# Patient Record
Sex: Female | Born: 1960
Health system: Southern US, Community
[De-identification: ages and names within clinical notes are randomized; demographics above are authoritative.]

## PROBLEM LIST (undated history)

## (undated) DIAGNOSIS — I10 Essential (primary) hypertension: Secondary | ICD-10-CM

## (undated) HISTORY — DX: Essential (primary) hypertension: I10

## (undated) HISTORY — PX: LEEP: SHX91

## (undated) HISTORY — PX: BREAST SURGERY: SHX581

## (undated) HISTORY — PX: BREAST EXCISIONAL BIOPSY: SUR124

---

## 2014-05-08 ENCOUNTER — Telehealth: Payer: Self-pay | Admitting: Family Medicine

## 2014-05-08 NOTE — Telephone Encounter (Signed)
Pt requesting new pt apt with pap and with a woman provider. [place] but now has insurance and needs to establish care. Pt given appt with Jannifer RodneyChristy Hawks 06/07/14 @ 9:10. Pt aware to arrive 15 minutes early and to bring insurance card and any current meds, pt states she takes amitryptaline and a BP med.

## 2014-06-07 ENCOUNTER — Ambulatory Visit (INDEPENDENT_AMBULATORY_CARE_PROVIDER_SITE_OTHER): Payer: PRIVATE HEALTH INSURANCE | Admitting: Family

## 2014-06-07 ENCOUNTER — Encounter (INDEPENDENT_AMBULATORY_CARE_PROVIDER_SITE_OTHER): Payer: Self-pay

## 2014-06-07 ENCOUNTER — Encounter: Payer: Self-pay | Admitting: Family

## 2014-06-07 VITALS — BP 140/91 | HR 81 | Temp 97.3°F | Ht 68.0 in | Wt 160.6 lb

## 2014-06-07 DIAGNOSIS — I10 Essential (primary) hypertension: Secondary | ICD-10-CM

## 2014-06-07 DIAGNOSIS — Z01419 Encounter for gynecological examination (general) (routine) without abnormal findings: Secondary | ICD-10-CM

## 2014-06-07 DIAGNOSIS — Z1321 Encounter for screening for nutritional disorder: Secondary | ICD-10-CM

## 2014-06-07 DIAGNOSIS — Z23 Encounter for immunization: Secondary | ICD-10-CM

## 2014-06-07 DIAGNOSIS — Z0000 Encounter for general adult medical examination without abnormal findings: Secondary | ICD-10-CM

## 2014-06-07 LAB — POCT URINALYSIS DIPSTICK
BILIRUBIN UA: NEGATIVE
Blood, UA: NEGATIVE
GLUCOSE UA: NEGATIVE
Ketones, UA: NEGATIVE
LEUKOCYTES UA: NEGATIVE
Nitrite, UA: NEGATIVE
PROTEIN UA: NEGATIVE
Spec Grav, UA: <=1.005
UROBILINOGEN UA: NEGATIVE
pH, UA: 6

## 2014-06-07 LAB — POCT UA - MICROSCOPIC ONLY
Bacteria, U Microscopic: NEGATIVE
Casts, Ur, LPF, POC: NEGATIVE
Crystals, Ur, HPF, POC: NEGATIVE
Mucus, UA: NEGATIVE
RBC, URINE, MICROSCOPIC: NEGATIVE
WBC, Ur, HPF, POC: NEGATIVE
Yeast, UA: NEGATIVE

## 2014-06-07 MED ORDER — ATENOLOL 50 MG PO TABS: 50.0000 mg | ORAL_TABLET | Freq: Every day | ORAL | Status: DC

## 2014-06-07 MED ORDER — HYDROCHLOROTHIAZIDE 25 MG PO TABS: 25.0000 mg | ORAL_TABLET | Freq: Every day | ORAL | Status: DC

## 2014-06-07 NOTE — Patient Instructions (Signed)

## 2014-06-07 NOTE — Addendum Note (Signed)
Addended by: Prescott GumLAND, Ramandeep Arington M on: 06/07/2014 10:36 AM   Modules accepted: Kipp BroodSmartSet

## 2014-06-07 NOTE — Addendum Note (Signed)
Addended by: Almeta MonasSTONE, JANIE M on: 06/07/2014 11:11 AM   Modules accepted: Orders

## 2014-06-07 NOTE — Progress Notes (Signed)
Subjective:    Patient ID: April Kane, female    DOB: [birth year], 54 y.o.   MRN: [id number]  Pt presents to the office for CPE with pap.  Gynecologic Exam Pertinent negatives include no headaches.  Hypertension This is a chronic problem. The current episode started more than 1 year ago. The problem has been waxing and waning since onset. The problem is uncontrolled. Pertinent negatives include no anxiety, headaches, palpitations, peripheral edema, shortness of breath or sweats. Risk factors for coronary artery disease include post-menopausal state, sedentary lifestyle and dyslipidemia. Past treatments include beta blockers (Pt had been on HCTZ but has not had it filled in last few months). The current treatment provides mild improvement. There is no history of kidney disease, CAD/MI, CVA, heart failure or a thyroid problem. There is no history of sleep apnea.      Review of Systems  Constitutional: Negative.   HENT: Negative.   Eyes: Negative.   Respiratory: Negative.  Negative for shortness of breath.   Cardiovascular: Negative.  Negative for palpitations.  Gastrointestinal: Negative.   Endocrine: Negative.   Genitourinary: Negative.   Musculoskeletal: Negative.   Neurological: Negative.  Negative for headaches.  Hematological: Negative.   Psychiatric/Behavioral: Negative.   All other systems reviewed and are negative.      Objective:   Physical Exam  Constitutional: She is oriented to person, place, and time. She appears well-developed and well-nourished. No distress.  HENT:  Head: Normocephalic and atraumatic.  Right Ear: External ear normal.  Mouth/Throat: Oropharynx is clear and moist.  Eyes: Pupils are equal, round, and reactive to light.  Neck: Normal range of motion. Neck supple. No thyromegaly present.  Cardiovascular: Normal rate, regular rhythm, normal heart sounds and intact distal pulses.   No murmur heard. Pulmonary/Chest: Effort normal and breath sounds normal.  No respiratory distress. She has no wheezes. Right breast exhibits no inverted nipple, no mass, no nipple discharge, no skin change and no tenderness. Left breast exhibits no inverted nipple, no mass, no nipple discharge, no skin change and no tenderness. Breasts are symmetrical.  Abdominal: Soft. Bowel sounds are normal. She exhibits no distension. There is no tenderness.  Genitourinary:  Bimanual exam- no adnexal masses or tenderness, ovaries nonpalpable   Cervix parous and pink- No discharge   Musculoskeletal: Normal range of motion. She exhibits no edema or tenderness.  Neurological: She is alert and oriented to person, place, and time. She has normal reflexes. No cranial nerve deficit.  Skin: Skin is warm and dry.  Psychiatric: She has a normal mood and affect. Her behavior is normal. Judgment and thought content normal.  Vitals reviewed.     BP 140/91 mmHg  Pulse 81  Temp(Src) 97.3 F (36.3 C) (Oral)  Ht '5\' 8"'  (1.727 m)  Wt 160 lb 9.6 oz (72.848 kg)  BMI 24.42 kg/m2  LMP 04/20/2013     Assessment & Plan:  1. Encounter for routine gynecological examination - POCT urinalysis dipstick - POCT UA - Microscopic Only - Pap IG w/ reflex to HPV when ASC-U  2. Essential hypertension - CMP14+EGFR - hydrochlorothiazide (HYDRODIURIL) 25 MG tablet; Take 1 tablet (25 mg total) by mouth daily.  Dispense: 90 tablet; Refill: 3 - atenolol (TENORMIN) 50 MG tablet; Take 1 tablet (50 mg total) by mouth daily.  Dispense: 90 tablet; Refill: 3  3. Annual physical exam - CMP14+EGFR - Lipid panel - Thyroid Panel With TSH - Vit D  25 hydroxy (rtn osteoporosis monitoring) - Pap IG w/  reflex to HPV when ASC-U  4. Encounter for vitamin deficiency screening - Vit D  25 hydroxy (rtn osteoporosis monitoring)   Continue all meds Labs pending Health Maintenance reviewed-hemoccult cards given to patient with directions, Pt to schedule mammogram appt Diet and exercise encouraged RTO 1  year  Evelina Dun, FNP

## 2014-06-08 LAB — THYROID PANEL WITH TSH
Free Thyroxine Index: 1.9 (ref 1.2–4.9)
T3 UPTAKE RATIO: 28 % (ref 24–39)
T4 TOTAL: 6.8 ug/dL (ref 4.5–12.0)
TSH: 1.09 u[IU]/mL (ref 0.450–4.500)

## 2014-06-08 LAB — CMP14+EGFR
A/G RATIO: 2.1 (ref 1.1–2.5)
ALT: 9 IU/L (ref 0–32)
AST: 16 IU/L (ref 0–40)
Albumin: 4.1 g/dL (ref 3.5–5.5)
Alkaline Phosphatase: 86 IU/L (ref 39–117)
BUN/Creatinine Ratio: 15 (ref 9–23)
BUN: 12 mg/dL (ref 6–24)
Bilirubin Total: 0.3 mg/dL (ref 0.0–1.2)
CALCIUM: 9.2 mg/dL (ref 8.7–10.2)
CHLORIDE: 103 mmol/L (ref 97–108)
CO2: 24 mmol/L (ref 18–29)
Creatinine, Ser: 0.81 mg/dL (ref 0.57–1.00)
GFR, EST AFRICAN AMERICAN: 97 mL/min/{1.73_m2} (ref >59)
GFR, EST NON AFRICAN AMERICAN: 84 mL/min/{1.73_m2} (ref >59)
Globulin, Total: 2 g/dL (ref 1.5–4.5)
Glucose: 112 mg/dL — ABNORMAL HIGH (ref 65–99)
POTASSIUM: 4 mmol/L (ref 3.5–5.2)
SODIUM: 142 mmol/L (ref 134–144)
TOTAL PROTEIN: 6.1 g/dL (ref 6.0–8.5)

## 2014-06-08 LAB — LIPID PANEL
Chol/HDL Ratio: 4.6 ratio units — ABNORMAL HIGH (ref 0.0–4.4)
Cholesterol, Total: 176 mg/dL (ref 100–199)
HDL: 38 mg/dL — AB (ref >39)
LDL Calculated: 115 mg/dL — ABNORMAL HIGH (ref 0–99)
Triglycerides: 115 mg/dL (ref 0–149)
VLDL CHOLESTEROL CAL: 23 mg/dL (ref 5–40)

## 2014-06-08 LAB — VITAMIN D 25 HYDROXY (VIT D DEFICIENCY, FRACTURES): VIT D 25 HYDROXY: 12.6 ng/mL — AB (ref 30.0–100.0)

## 2014-06-12 LAB — PAP IG W/ RFLX HPV ASCU: PAP Smear Comment: 0

## 2014-11-23 ENCOUNTER — Telehealth: Payer: Self-pay | Admitting: Family

## 2014-11-23 NOTE — Telephone Encounter (Signed)
Christys pt 

## 2014-11-24 MED ORDER — AMITRIPTYLINE HCL 100 MG PO TABS: 100.0000 mg | ORAL_TABLET | Freq: Every day | ORAL | Status: DC

## 2014-11-24 NOTE — Telephone Encounter (Signed)
Patient informed that prescription was sent in.

## 2014-11-24 NOTE — Telephone Encounter (Signed)
elavil filled

## 2015-03-27 ENCOUNTER — Other Ambulatory Visit: Payer: Self-pay | Admitting: Nurse Practitioner

## 2015-03-27 NOTE — Telephone Encounter (Signed)
[facility] 06/07/14  [facility]

## 2015-03-28 ENCOUNTER — Other Ambulatory Visit: Payer: Self-pay | Admitting: Family

## 2015-03-28 NOTE — Telephone Encounter (Signed)
Pt advised rx was sent to pharmacy yesterday.

## 2015-06-05 ENCOUNTER — Other Ambulatory Visit: Payer: Self-pay | Admitting: Family

## 2015-06-06 ENCOUNTER — Other Ambulatory Visit: Payer: PRIVATE HEALTH INSURANCE | Admitting: Family

## 2015-06-13 ENCOUNTER — Ambulatory Visit (INDEPENDENT_AMBULATORY_CARE_PROVIDER_SITE_OTHER): Payer: Managed Care, Other (non HMO) | Admitting: Family

## 2015-06-13 ENCOUNTER — Encounter: Payer: Self-pay | Admitting: Family

## 2015-06-13 VITALS — BP 123/80 | HR 84 | Temp 98.2°F | Ht 68.0 in | Wt 158.0 lb

## 2015-06-13 DIAGNOSIS — Z0000 Encounter for general adult medical examination without abnormal findings: Secondary | ICD-10-CM

## 2015-06-13 DIAGNOSIS — M797 Fibromyalgia: Secondary | ICD-10-CM

## 2015-06-13 DIAGNOSIS — I10 Essential (primary) hypertension: Secondary | ICD-10-CM

## 2015-06-13 DIAGNOSIS — Z1159 Encounter for screening for other viral diseases: Secondary | ICD-10-CM

## 2015-06-13 DIAGNOSIS — Z1211 Encounter for screening for malignant neoplasm of colon: Secondary | ICD-10-CM

## 2015-06-13 NOTE — Patient Instructions (Signed)
Health Maintenance, Female Adopting a healthy lifestyle and getting preventive care can go a long way to promote health and wellness. Talk with your health care provider about what schedule of regular examinations is right for you. This is a good chance for you to check in with your provider about disease prevention and staying healthy. In between checkups, there are plenty of things you can do on your own. Experts have done a lot of research about which lifestyle changes and preventive measures are most likely to keep you healthy. Ask your health care provider for more information. WEIGHT AND DIET  Eat a healthy diet  Be sure to include plenty of vegetables, fruits, low-fat dairy products, and lean protein.  Do not eat a lot of foods high in solid fats, added sugars, or salt.  Get regular exercise. This is one of the most important things you can do for your health.  Most adults should exercise for at least 150 minutes each week. The exercise should increase your heart rate and make you sweat (moderate-intensity exercise).  Most adults should also do strengthening exercises at least twice a week. This is in addition to the moderate-intensity exercise.  Maintain a healthy weight  Body mass index (BMI) is a measurement that can be used to identify possible weight problems. It estimates body fat based on height and weight. Your health care provider can help determine your BMI and help you achieve or maintain a healthy weight.  For females 20 years of age and older:   A BMI below 18.5 is considered underweight.  A BMI of 18.5 to 24.9 is normal.  A BMI of 25 to 29.9 is considered overweight.  A BMI of 30 and above is considered obese.  Watch levels of cholesterol and blood lipids  You should start having your blood tested for lipids and cholesterol at 53 years of age, then have this test every 5 years.  You may need to have your cholesterol levels checked more often if:  Your lipid  or cholesterol levels are high.  You are older than 53 years of age.  You are at high risk for heart disease.  CANCER SCREENING   Lung Cancer  Lung cancer screening is recommended for adults 55-80 years old who are at high risk for lung cancer because of a history of smoking.  A yearly low-dose CT scan of the lungs is recommended for people who:  Currently smoke.  Have quit within the past 15 years.  Have at least a 30-pack-year history of smoking. A pack year is smoking an average of one pack of cigarettes a day for 1 year.  Yearly screening should continue until it has been 15 years since you quit.  Yearly screening should stop if you develop a health problem that would prevent you from having lung cancer treatment.  Breast Cancer  Practice breast self-awareness. This means understanding how your breasts normally appear and feel.  It also means doing regular breast self-exams. Let your health care provider know about any changes, no matter how small.  If you are in your 20s or 30s, you should have a clinical breast exam (CBE) by a health care provider every 1-3 years as part of a regular health exam.  If you are 40 or older, have a CBE every year. Also consider having a breast X-ray (mammogram) every year.  If you have a family history of breast cancer, talk to your health care provider about genetic screening.  If you   are at high risk for breast cancer, talk to your health care provider about having an MRI and a mammogram every year.  Breast cancer gene (BRCA) assessment is recommended for women who have family members with BRCA-related cancers. BRCA-related cancers include:  Breast.  Ovarian.  Tubal.  Peritoneal cancers.  Results of the assessment will determine the need for genetic counseling and BRCA1 and BRCA2 testing. Cervical Cancer Your health care provider may recommend that you be screened regularly for cancer of the pelvic organs (ovaries, uterus, and  vagina). This screening involves a pelvic examination, including checking for microscopic changes to the surface of your cervix (Pap test). You may be encouraged to have this screening done every 3 years, beginning at age 21.  For women ages 30-65, health care providers may recommend pelvic exams and Pap testing every 3 years, or they may recommend the Pap and pelvic exam, combined with testing for human papilloma virus (HPV), every 5 years. Some types of HPV increase your risk of cervical cancer. Testing for HPV may also be done on women of any age with unclear Pap test results.  Other health care providers may not recommend any screening for nonpregnant women who are considered low risk for pelvic cancer and who do not have symptoms. Ask your health care provider if a screening pelvic exam is right for you.  If you have had past treatment for cervical cancer or a condition that could lead to cancer, you need Pap tests and screening for cancer for at least 20 years after your treatment. If Pap tests have been discontinued, your risk factors (such as having a new sexual partner) need to be reassessed to determine if screening should resume. Some women have medical problems that increase the chance of getting cervical cancer. In these cases, your health care provider may recommend more frequent screening and Pap tests. Colorectal Cancer  This type of cancer can be detected and often prevented.  Routine colorectal cancer screening usually begins at 53 years of age and continues through 53 years of age.  Your health care provider may recommend screening at an earlier age if you have risk factors for colon cancer.  Your health care provider may also recommend using home test kits to check for hidden blood in the stool.  A small camera at the end of a tube can be used to examine your colon directly (sigmoidoscopy or colonoscopy). This is done to check for the earliest forms of colorectal  cancer.  Routine screening usually begins at age 50.  Direct examination of the colon should be repeated every 5-10 years through 53 years of age. However, you may need to be screened more often if early forms of precancerous polyps or small growths are found. Skin Cancer  Check your skin from head to toe regularly.  Tell your health care provider about any new moles or changes in moles, especially if there is a change in a mole's shape or color.  Also tell your health care provider if you have a mole that is larger than the size of a pencil eraser.  Always use sunscreen. Apply sunscreen liberally and repeatedly throughout the day.  Protect yourself by wearing long sleeves, pants, a wide-brimmed hat, and sunglasses whenever you are outside. HEART DISEASE, DIABETES, AND HIGH BLOOD PRESSURE   High blood pressure causes heart disease and increases the risk of stroke. High blood pressure is more likely to develop in:  People who have blood pressure in the high end   of the normal range (130-139/85-89 mm Hg).  People who are overweight or obese.  People who are African American.  If you are 38-23 years of age, have your blood pressure checked every 3-5 years. If you are 61 years of age or older, have your blood pressure checked every year. You should have your blood pressure measured twice--once when you are at a hospital or clinic, and once when you are not at a hospital or clinic. Record the average of the two measurements. To check your blood pressure when you are not at a hospital or clinic, you can use:  An automated blood pressure machine at a pharmacy.  A home blood pressure monitor.  If you are between 45 years and 39 years old, ask your health care provider if you should take aspirin to prevent strokes.  Have regular diabetes screenings. This involves taking a blood sample to check your fasting blood sugar level.  If you are at a normal weight and have a low risk for diabetes,  have this test once every three years after 53 years of age.  If you are overweight and have a high risk for diabetes, consider being tested at a younger age or more often. PREVENTING INFECTION  Hepatitis B  If you have a higher risk for hepatitis B, you should be screened for this virus. You are considered at high risk for hepatitis B if:  You were born in a country where hepatitis B is common. Ask your health care provider which countries are considered high risk.  Your parents were born in a high-risk country, and you have not been immunized against hepatitis B (hepatitis B vaccine).  You have HIV or AIDS.  You use needles to inject street drugs.  You live with someone who has hepatitis B.  You have had sex with someone who has hepatitis B.  You get hemodialysis treatment.  You take certain medicines for conditions, including cancer, organ transplantation, and autoimmune conditions. Hepatitis C  Blood testing is recommended for:  Everyone born from [birth year] through 1963.  Anyone with known risk factors for hepatitis C. Sexually transmitted infections (STIs)  You should be screened for sexually transmitted infections (STIs) including gonorrhea and chlamydia if:  You are sexually active and are younger than 53 years of age.  You are older than 53 years of age and your health care provider tells you that you are at risk for this type of infection.  Your sexual activity has changed since you were last screened and you are at an increased risk for chlamydia or gonorrhea. Ask your health care provider if you are at risk.  If you do not have HIV, but are at risk, it may be recommended that you take a prescription medicine daily to prevent HIV infection. This is called pre-exposure prophylaxis (PrEP). You are considered at risk if:  You are sexually active and do not regularly use condoms or know the HIV status of your partner(s).  You take drugs by injection.  You are sexually  active with a partner who has HIV. Talk with your health care provider about whether you are at high risk of being infected with HIV. If you choose to begin PrEP, you should first be tested for HIV. You should then be tested every 3 months for as long as you are taking PrEP.  PREGNANCY   If you are premenopausal and you may become pregnant, ask your health care provider about preconception counseling.  If you may  become pregnant, take 400 to 800 micrograms (mcg) of folic acid every day.  If you want to prevent pregnancy, talk to your health care provider about birth control (contraception). OSTEOPOROSIS AND MENOPAUSE   Osteoporosis is a disease in which the bones lose minerals and strength with aging. This can result in serious bone fractures. Your risk for osteoporosis can be identified using a bone density scan.  If you are 61 years of age or older, or if you are at risk for osteoporosis and fractures, ask your health care provider if you should be screened.  Ask your health care provider whether you should take a calcium or vitamin D supplement to lower your risk for osteoporosis.  Menopause may have certain physical symptoms and risks.  Hormone replacement therapy may reduce some of these symptoms and risks. Talk to your health care provider about whether hormone replacement therapy is right for you.  HOME CARE INSTRUCTIONS   Schedule regular health, dental, and eye exams.  Stay current with your immunizations.   Do not use any tobacco products including cigarettes, chewing tobacco, or electronic cigarettes.  If you are pregnant, do not drink alcohol.  If you are breastfeeding, limit how much and how often you drink alcohol.  Limit alcohol intake to no more than 1 drink per day for nonpregnant women. One drink equals 12 ounces of beer, 5 ounces of wine, or 1 ounces of hard liquor.  Do not use street drugs.  Do not share needles.  Ask your health care provider for help if  you need support or information about quitting drugs.  Tell your health care provider if you often feel depressed.  Tell your health care provider if you have ever been abused or do not feel safe at home.   This information is not intended to replace advice given to you by your health care provider. Make sure you discuss any questions you have with your health care provider.   Document Released: 10/20/2010 Document Revised: 04/27/2014 Document Reviewed: 03/08/2013 Elsevier Interactive Patient Education [employer].

## 2015-06-13 NOTE — Addendum Note (Signed)
Addended by: Orma Render F on: 06/13/2015 10:47 AM   Modules accepted: Kipp Brood

## 2015-06-13 NOTE — Progress Notes (Signed)
   Subjective:    Patient ID: April Kane, female    DOB: 06-20-1960, 55 y.o.   MRN: [id number]  Pt presents to the office for CPE with pap.  Hypertension This is a chronic problem. The current episode started more than 1 year ago. The problem has been resolved since onset. The problem is controlled. Pertinent negatives include no anxiety, headaches, malaise/fatigue, palpitations, peripheral edema or sweats. Risk factors for coronary artery disease include post-menopausal state, sedentary lifestyle and dyslipidemia. Past treatments include beta blockers and diuretics. The current treatment provides moderate improvement. There is no history of kidney disease, CAD/MI, CVA, heart failure or a thyroid problem. There is no history of sleep apnea.  Anxiety Patient reports no palpitations.        Review of Systems  Constitutional: Negative.  Negative for malaise/fatigue.  HENT: Negative.   Eyes: Negative.   Respiratory: Negative.   Cardiovascular: Negative.  Negative for palpitations.  Gastrointestinal: Negative.   Endocrine: Negative.   Genitourinary: Negative.   Musculoskeletal: Negative.   Neurological: Negative.  Negative for headaches.  Hematological: Negative.   Psychiatric/Behavioral: Negative.   All other systems reviewed and are negative.      Objective:   Physical Exam  Constitutional: She is oriented to person, place, and time. She appears well-developed and well-nourished. No distress.  HENT:  Head: Normocephalic and atraumatic.  Right Ear: External ear normal.  Mouth/Throat: Oropharynx is clear and moist.  Eyes: Pupils are equal, round, and reactive to light.  Neck: Normal range of motion. Neck supple. No thyromegaly present.  Cardiovascular: Normal rate, regular rhythm, normal heart sounds and intact distal pulses.   No murmur heard. Pulmonary/Chest: Effort normal and breath sounds normal. No respiratory distress. She has no wheezes.  Abdominal: Soft. Bowel sounds are  normal. She exhibits no distension. There is no tenderness.  Genitourinary:     Musculoskeletal: Normal range of motion. She exhibits no edema or tenderness.  Neurological: She is alert and oriented to person, place, and time. She has normal reflexes. No cranial nerve deficit.  Skin: Skin is warm and dry.  Psychiatric: She has a normal mood and affect. Her behavior is normal. Judgment and thought content normal.  Vitals reviewed.     BP 123/80 mmHg  Pulse 84  Temp(Src) 98.2 F (36.8 C) (Oral)  Ht '5\' 8"'$  (1.727 m)  Wt 158 lb (71.668 kg)  BMI 24.03 kg/m2  LMP 04/20/2013     Assessment & Plan:  1. Essential hypertension - CMP14+EGFR  2. Fibromyalgia - CMP14+EGFR  3. Annual physical exam - CMP14+EGFR - Lipid panel - Thyroid Panel With TSH - VITAMIN D 25 Hydroxy (Vit-D Deficiency, Fractures) - Hepatitis C antibody  4. Need for hepatitis C screening test - CMP14+EGFR - Hepatitis C antibody  5. Colon cancer screening - CMP14+EGFR - Fecal occult blood, imunochemical; Future   Continue all meds Labs pending Health Maintenance reviewed- Pt has mammogram scheduled on May 30th Diet and exercise encouraged RTO 1 year  Evelina Dun, FNP

## 2015-06-14 ENCOUNTER — Other Ambulatory Visit: Payer: Self-pay | Admitting: Family

## 2015-06-14 DIAGNOSIS — E559 Vitamin D deficiency, unspecified: Secondary | ICD-10-CM

## 2015-06-14 DIAGNOSIS — E785 Hyperlipidemia, unspecified: Secondary | ICD-10-CM

## 2015-06-14 LAB — CMP14+EGFR
ALT: 18 IU/L (ref 0–32)
AST: 18 IU/L (ref 0–40)
Albumin/Globulin Ratio: 1.8 (ref 1.1–2.5)
Albumin: 4.6 g/dL (ref 3.5–5.5)
Alkaline Phosphatase: 83 IU/L (ref 39–117)
BILIRUBIN TOTAL: 0.2 mg/dL (ref 0.0–1.2)
BUN / CREAT RATIO: 9 (ref 9–23)
BUN: 8 mg/dL (ref 6–24)
CHLORIDE: 105 mmol/L (ref 96–106)
CO2: 24 mmol/L (ref 18–29)
Calcium: 9.5 mg/dL (ref 8.7–10.2)
Creatinine, Ser: 0.85 mg/dL (ref 0.57–1.00)
GFR calc Af Amer: 91 mL/min/{1.73_m2} (ref >59)
GFR calc non Af Amer: 79 mL/min/{1.73_m2} (ref >59)
Globulin, Total: 2.6 g/dL (ref 1.5–4.5)
Glucose: 100 mg/dL — ABNORMAL HIGH (ref 65–99)
POTASSIUM: 3.5 mmol/L (ref 3.5–5.2)
Sodium: 150 mmol/L — ABNORMAL HIGH (ref 134–144)
Total Protein: 7.2 g/dL (ref 6.0–8.5)

## 2015-06-14 LAB — LIPID PANEL
Chol/HDL Ratio: 5.2 ratio units — ABNORMAL HIGH (ref 0.0–4.4)
Cholesterol, Total: 199 mg/dL (ref 100–199)
HDL: 38 mg/dL — AB (ref >39)
LDL Calculated: 131 mg/dL — ABNORMAL HIGH (ref 0–99)
Triglycerides: 150 mg/dL — ABNORMAL HIGH (ref 0–149)
VLDL Cholesterol Cal: 30 mg/dL (ref 5–40)

## 2015-06-14 LAB — THYROID PANEL WITH TSH
FREE THYROXINE INDEX: 1.5 (ref 1.2–4.9)
T3 Uptake Ratio: 24 % (ref 24–39)
T4 TOTAL: 6.4 ug/dL (ref 4.5–12.0)
TSH: 1.72 u[IU]/mL (ref 0.450–4.500)

## 2015-06-14 LAB — VITAMIN D 25 HYDROXY (VIT D DEFICIENCY, FRACTURES): VIT D 25 HYDROXY: 15.2 ng/mL — AB (ref 30.0–100.0)

## 2015-06-14 LAB — HEPATITIS C ANTIBODY: Hep C Virus Ab: <0.1 s/co ratio (ref 0.0–0.9)

## 2015-06-14 MED ORDER — VITAMIN D (ERGOCALCIFEROL) 1.25 MG (50000 UNIT) PO CAPS: 50000.0000 [IU] | ORAL_CAPSULE | ORAL | Status: DC

## 2015-06-14 MED ORDER — SIMVASTATIN 20 MG PO TABS: 20.0000 mg | ORAL_TABLET | Freq: Every day | ORAL | Status: DC

## 2015-07-18 ENCOUNTER — Ambulatory Visit: Payer: Managed Care, Other (non HMO) | Admitting: Family

## 2015-07-19 ENCOUNTER — Encounter: Payer: Self-pay | Admitting: Family

## 2015-08-28 ENCOUNTER — Other Ambulatory Visit: Payer: Self-pay | Admitting: Family

## 2015-09-17 ENCOUNTER — Encounter: Payer: Managed Care, Other (non HMO) | Admitting: *Deleted

## 2015-11-06 ENCOUNTER — Telehealth: Payer: Self-pay | Admitting: Family

## 2015-11-06 NOTE — Telephone Encounter (Signed)
appt made

## 2015-11-06 NOTE — Telephone Encounter (Signed)
Pt went to urgent care in the spring. Took antibiotic then   Now wants antibiotic called in  Dizzy, inner ear, sinus pressure, ha, eyes hurt, no fever, congestion-green    Last seen here 06/13/15.  She is working until 7 pm tonight and would appreciate if something can be called in

## 2015-11-06 NOTE — Telephone Encounter (Signed)
Can't call in antibiotics, needs to be seen to know which is best.

## 2015-11-07 ENCOUNTER — Encounter: Payer: Self-pay | Admitting: Family

## 2015-11-07 ENCOUNTER — Ambulatory Visit (INDEPENDENT_AMBULATORY_CARE_PROVIDER_SITE_OTHER): Payer: 59 | Admitting: Family

## 2015-11-07 VITALS — BP 127/84 | HR 71 | Temp 97.9°F | Ht 68.0 in | Wt 158.8 lb

## 2015-11-07 DIAGNOSIS — J0110 Acute frontal sinusitis, unspecified: Secondary | ICD-10-CM | POA: Diagnosis not present

## 2015-11-07 MED ORDER — FLUTICASONE PROPIONATE 50 MCG/ACT NA SUSP: 2.0000 | Freq: Every day | NASAL | Status: DC

## 2015-11-07 MED ORDER — AMOXICILLIN-POT CLAVULANATE 875-125 MG PO TABS: 1.0000 | ORAL_TABLET | Freq: Two times a day (BID) | ORAL | Status: DC

## 2015-11-07 NOTE — Progress Notes (Signed)
Subjective:    Patient ID: April Kane, female    DOB: 05-30-1960, 55 y.o.   MRN: [id number]  Sinus Problem This is a new problem. The current episode started in the past 7 days. The problem has been gradually worsening since onset. There has been no fever. Her pain is at a severity of 10/10. The pain is mild. Associated symptoms include congestion, coughing, ear pain, headaches, a hoarse voice, sinus pressure, sneezing, a sore throat and swollen glands. Pertinent negatives include no shortness of breath. (Dizziness ) Past treatments include lying down and acetaminophen. The treatment provided mild relief.  Headache  Associated symptoms include coughing, ear pain, sinus pressure, a sore throat and swollen glands.      Review of Systems  Constitutional: Negative.   HENT: Positive for congestion, ear pain, hoarse voice, sinus pressure, sneezing and sore throat.   Eyes: Negative.   Respiratory: Positive for cough. Negative for shortness of breath.   Cardiovascular: Negative.  Negative for palpitations.  Gastrointestinal: Negative.   Endocrine: Negative.   Genitourinary: Negative.   Musculoskeletal: Negative.   Neurological: Positive for headaches.  Hematological: Negative.   Psychiatric/Behavioral: Negative.   All other systems reviewed and are negative.      Objective:   Physical Exam  Constitutional: She is oriented to person, place, and time. She appears well-developed and well-nourished. No distress.  HENT:  Head: Normocephalic and atraumatic.  Right Ear: External ear normal.  Left Ear: Tympanic membrane is bulging.  Nose: Mucosal edema and rhinorrhea present. Right sinus exhibits frontal sinus tenderness. Left sinus exhibits frontal sinus tenderness.  Mouth/Throat: Posterior oropharyngeal erythema present.  Eyes: Pupils are equal, round, and reactive to light.  Neck: Normal range of motion. Neck supple. No thyromegaly present.  Cardiovascular: Normal rate, regular rhythm,  normal heart sounds and intact distal pulses.   No murmur heard. Pulmonary/Chest: Effort normal and breath sounds normal. No respiratory distress. She has no wheezes.  Abdominal: Soft. Bowel sounds are normal. She exhibits no distension. There is no tenderness.  Musculoskeletal: Normal range of motion. She exhibits no edema or tenderness.  Neurological: She is alert and oriented to person, place, and time. She has normal reflexes. No cranial nerve deficit.  Skin: Skin is warm and dry.  Psychiatric: She has a normal mood and affect. Her behavior is normal. Judgment and thought content normal.  Vitals reviewed.     BP 127/84 mmHg  Pulse 71  Temp(Src) 97.9 F (36.6 C) (Oral)  Ht  (1.727 m)  Wt 158 lb 12.8 oz (72.031 kg)  BMI 24.15 kg/m2  LMP 04/20/2013     Assessment & Plan:  1. Acute frontal sinusitis, recurrence not specified -- Take meds as prescribed - Use a cool mist humidifier  -Use saline nose sprays frequently -Saline irrigations of the nose can be very helpful if done frequently.  * 4X daily for 1 week*  * Use of a nettie pot can be helpful with this. Follow directions with this* -Force fluids -For any cough or congestion  Use plain Mucinex- regular strength or max strength is fine   * Children- consult with Pharmacist for dosing -For fever or aces or pains- take tylenol or ibuprofen appropriate for age and weight.  * for fevers greater than 101 orally you may alternate ibuprofen and tylenol every  3 hours. -Throat lozenges if help - amoxicillin-clavulanate (AUGMENTIN) 875-125 MG tablet; Take 1 tablet by mouth 2 (two) times daily.  Dispense: 14 tablet; Refill: 0 -  fluticasone (FLONASE) 50 MCG/ACT nasal spray; Place 2 sprays into both nostrils daily.  Dispense: 16 g; Refill: 6   Jannifer Rodneyhristy Kahner Yanik, FNP

## 2015-11-07 NOTE — Patient Instructions (Signed)
Sinusitis, Adult Sinusitis is redness, soreness, and inflammation of the paranasal sinuses. Paranasal sinuses are air pockets within the bones of your face. They are located beneath your eyes, in the middle of your forehead, and above your eyes. In healthy paranasal sinuses, mucus is able to drain out, and air is able to circulate through them by way of your nose. However, when your paranasal sinuses are inflamed, mucus and air can become trapped. This can allow bacteria and other germs to grow and cause infection. Sinusitis can develop quickly and last only a short time (acute) or continue over a long period (chronic). Sinusitis that lasts for more than 12 weeks is considered chronic. CAUSES Causes of sinusitis include:  Allergies.  Structural abnormalities, such as displacement of the cartilage that separates your nostrils (deviated septum), which can decrease the air flow through your nose and sinuses and affect sinus drainage.  Functional abnormalities, such as when the small hairs (cilia) that line your sinuses and help remove mucus do not work properly or are not present. SIGNS AND SYMPTOMS Symptoms of acute and chronic sinusitis are the same. The primary symptoms are pain and pressure around the affected sinuses. Other symptoms include:  Upper toothache.  Earache.  Headache.  Bad breath.  Decreased sense of smell and taste.  A cough, which worsens when you are lying flat.  Fatigue.  Fever.  Thick drainage from your nose, which often is green and may contain pus (purulent).  Swelling and warmth over the affected sinuses. DIAGNOSIS Your health care provider will perform a physical exam. During your exam, your health care provider may perform any of the following to help determine if you have acute sinusitis or chronic sinusitis:  Look in your nose for signs of abnormal growths in your nostrils (nasal polyps).  Tap over the affected sinus to check for signs of  infection.  View the inside of your sinuses using an imaging device that has a light attached (endoscope). If your health care provider suspects that you have chronic sinusitis, one or more of the following tests may be recommended:  Allergy tests.  Nasal culture. A sample of mucus is taken from your nose, sent to a lab, and screened for bacteria.  Nasal cytology. A sample of mucus is taken from your nose and examined by your health care provider to determine if your sinusitis is related to an allergy. TREATMENT Most cases of acute sinusitis are related to a viral infection and will resolve on their own within 10 days. Sometimes, medicines are prescribed to help relieve symptoms of both acute and chronic sinusitis. These may include pain medicines, decongestants, nasal steroid sprays, or saline sprays. However, for sinusitis related to a bacterial infection, your health care provider will prescribe antibiotic medicines. These are medicines that will help kill the bacteria causing the infection. Rarely, sinusitis is caused by a fungal infection. In these cases, your health care provider will prescribe antifungal medicine. For some cases of chronic sinusitis, surgery is needed. Generally, these are cases in which sinusitis recurs more than 3 times per year, despite other treatments. HOME CARE INSTRUCTIONS  Drink plenty of water. Water helps thin the mucus so your sinuses can drain more easily.  Use a humidifier.  Inhale steam 3-4 times a day (for example, sit in the bathroom with the shower running).  Apply a warm, moist washcloth to your face 3-4 times a day, or as directed by your health care provider.  Use saline nasal sprays to help   moisten and clean your sinuses.  Take medicines only as directed by your health care provider.  If you were prescribed either an antibiotic or antifungal medicine, finish it all even if you start to feel better. SEEK IMMEDIATE MEDICAL CARE IF:  You have  increasing pain or severe headaches.  You have nausea, vomiting, or drowsiness.  You have swelling around your face.  You have vision problems.  You have a stiff neck.  You have difficulty breathing.   This information is not intended to replace advice given to you by your health care provider. Make sure you discuss any questions you have with your health care provider.   Document Released: 04/06/2005 Document Revised: 04/27/2014 Document Reviewed: 04/21/2011 Elsevier Interactive Patient Education 2016 Elsevier Inc[employer]  - Take meds as prescribed - Use a cool mist humidifier  -Use saline nose sprays frequently -Saline irrigations of the nose can be very helpful if done frequently.  * 4X daily for 1 week*  * Use of a nettie pot can be helpful with this. Follow directions with this* -Force fluids -For any cough or congestion  Use plain Mucinex- regular strength or max strength is fine   * Children- consult with Pharmacist for dosing -For fever or aces or pains- take tylenol or ibuprofen appropriate for age and weight.  * for fevers greater than 101 orally you may alternate ibuprofen and tylenol every  3 hours. -Throat lozenges if help   April Cihlar, FNP   

## 2015-12-23 ENCOUNTER — Other Ambulatory Visit: Payer: Self-pay | Admitting: Family

## 2016-01-04 ENCOUNTER — Other Ambulatory Visit: Payer: Self-pay | Admitting: Family

## 2016-01-09 ENCOUNTER — Other Ambulatory Visit: Payer: 59 | Admitting: Family

## 2016-01-10 ENCOUNTER — Encounter: Payer: Self-pay | Admitting: Family

## 2016-01-15 ENCOUNTER — Ambulatory Visit (INDEPENDENT_AMBULATORY_CARE_PROVIDER_SITE_OTHER): Payer: 59 | Admitting: Family

## 2016-01-15 ENCOUNTER — Encounter: Payer: Self-pay | Admitting: Family

## 2016-01-15 VITALS — BP 121/88 | HR 78 | Temp 97.2°F | Ht 68.0 in | Wt 161.6 lb

## 2016-01-15 DIAGNOSIS — M797 Fibromyalgia: Secondary | ICD-10-CM

## 2016-01-15 DIAGNOSIS — Z01419 Encounter for gynecological examination (general) (routine) without abnormal findings: Secondary | ICD-10-CM

## 2016-01-15 DIAGNOSIS — E559 Vitamin D deficiency, unspecified: Secondary | ICD-10-CM

## 2016-01-15 DIAGNOSIS — Z0000 Encounter for general adult medical examination without abnormal findings: Secondary | ICD-10-CM | POA: Diagnosis not present

## 2016-01-15 DIAGNOSIS — E785 Hyperlipidemia, unspecified: Secondary | ICD-10-CM

## 2016-01-15 DIAGNOSIS — I10 Essential (primary) hypertension: Secondary | ICD-10-CM

## 2016-01-15 DIAGNOSIS — K219 Gastro-esophageal reflux disease without esophagitis: Secondary | ICD-10-CM | POA: Insufficient documentation

## 2016-01-15 MED ORDER — OMEPRAZOLE 20 MG PO CPDR: 20.0000 mg | DELAYED_RELEASE_CAPSULE | Freq: Every day | ORAL | 3 refills | Status: DC

## 2016-01-15 NOTE — Progress Notes (Signed)
Subjective:    Patient ID: April Kane, female    DOB: 02/17/1961, 55 y.o.   MRN: [id number]  Pt presents to the office for CPE with pap.  Gynecologic Exam  The patient's pertinent negatives include no genital lesions, genital odor or vaginal bleeding. This is a chronic problem. The current episode started more than 1 year ago. The problem has been resolved. Pertinent negatives include no headaches or sore throat.  Hypertension  This is a chronic problem. The current episode started more than 1 year ago. The problem has been resolved since onset. The problem is controlled. Pertinent negatives include no anxiety, headaches, malaise/fatigue, palpitations, peripheral edema or sweats. Risk factors for coronary artery disease include post-menopausal state, sedentary lifestyle and dyslipidemia. Past treatments include beta blockers and diuretics. The current treatment provides moderate improvement. There is no history of kidney disease, CAD/MI, CVA, heart failure or a thyroid problem. There is no history of sleep apnea.  Anxiety  Presents for follow-up visit. Patient reports no depressed mood, excessive worry, irritability, nervous/anxious behavior or palpitations. The severity of symptoms is moderate. The quality of sleep is good.    Hyperlipidemia  This is a chronic problem. The problem is uncontrolled. Recent lipid tests were reviewed and are high. Exacerbating diseases include obesity. Current antihyperlipidemic treatment includes diet change. The current treatment provides mild improvement of lipids. Risk factors for coronary artery disease include dyslipidemia, obesity, hypertension and a sedentary lifestyle.  Gastroesophageal Reflux  She complains of coughing (Sometimes), heartburn and a hoarse voice. She reports no dysphagia, no sore throat or no wheezing. This is a chronic problem. The current episode started more than 1 year ago. The problem occurs occasionally. The heartburn duration is several  minutes. The heartburn is located in the substernum. The heartburn is of moderate intensity. The symptoms are aggravated by bending, certain foods, lying down and smoking. She has tried an antacid for the symptoms. The treatment provided mild relief.  Fibromyalgia PT states this is stable and takes the amitriptyline at bedtime that helps.     Review of Systems  Constitutional: Negative.  Negative for irritability and malaise/fatigue.  HENT: Positive for hoarse voice. Negative for sore throat.   Eyes: Negative.   Respiratory: Positive for cough (Sometimes). Negative for wheezing.   Cardiovascular: Negative.  Negative for palpitations.  Gastrointestinal: Positive for heartburn. Negative for dysphagia.  Endocrine: Negative.   Genitourinary: Negative.   Musculoskeletal: Negative.   Neurological: Negative.  Negative for headaches.  Hematological: Negative.   Psychiatric/Behavioral: Negative.  The patient is not nervous/anxious.   All other systems reviewed and are negative.      Objective:   Physical Exam  Constitutional: She is oriented to person, place, and time. She appears well-developed and well-nourished. No distress.  HENT:  Head: Normocephalic and atraumatic.  Right Ear: External ear normal.  Mouth/Throat: Oropharynx is clear and moist.  Eyes: Pupils are equal, round, and reactive to light.  Neck: Normal range of motion. Neck supple. No thyromegaly present.  Cardiovascular: Normal rate, regular rhythm, normal heart sounds and intact distal pulses.   No murmur heard. Pulmonary/Chest: Effort normal and breath sounds normal. No respiratory distress. She has no wheezes. Right breast exhibits no inverted nipple, no mass, no nipple discharge, no skin change and no tenderness. Left breast exhibits no inverted nipple, no mass, no nipple discharge, no skin change and no tenderness. Breasts are symmetrical.  Abdominal: Soft. Bowel sounds are normal. She exhibits no distension. There is no  tenderness.  Genitourinary: Vagina normal.  Genitourinary Comments: Bimanual exam- no adnexal masses or tenderness, ovaries nonpalpable   Cervix parous and pink- No discharge    Musculoskeletal: Normal range of motion. She exhibits no edema or tenderness.  Neurological: She is alert and oriented to person, place, and time. She has normal reflexes. No cranial nerve deficit.  Skin: Skin is warm and dry.  Psychiatric: She has a normal mood and affect. Her behavior is normal. Judgment and thought content normal.  Vitals reviewed.     BP 121/88   Pulse 78   Temp 97.2 F (36.2 C) (Oral)   Ht _0  (1.727 m)   Wt 161 lb 9.6 oz (73.3 kg)   LMP 04/20/2013   BMI 24.57 kg/m      Assessment & Plan:  1. Essential hypertension - CMP14+EGFR  2. Hyperlipidemia - CMP14+EGFR - Lipid panel  3. Vitamin D deficiency - CMP14+EGFR  4. Fibromyalgia - CMP14+EGFR  5. Gastroesophageal reflux disease, esophagitis presence not specified -PT started on prilosec today -Diet discussed- Avoid fried, spicy, citrus foods, caffeine and alcohol -Do not eat 2-3 hours before bedtime -Encouraged small frequent meals -Avoid NSAID's - CMP14+EGFR - CBC with Differential/Platelet - omeprazole (PRILOSEC) 20 MG capsule; Take 1 capsule (20 mg total) by mouth daily.  Dispense: 90 capsule; Refill: 3  6. Encounter for routine gynecological examination - CMP14+EGFR - Pap IG w/ reflex to HPV when ASC-U  7. Physical exam - CMP14+EGFR - CBC with Differential/Platelet - Lipid panel - Pap IG w/ reflex to HPV when ASC-U   Continue all meds Labs pending Health Maintenance reviewed Diet and exercise encouraged RTO 6 months  Evelina Dun, FNP

## 2016-01-15 NOTE — Patient Instructions (Addendum)
Food Choices for Gastroesophageal Reflux Disease, Adult When you have gastroesophageal reflux disease (GERD), the foods you eat and your eating habits are very important. Choosing the right foods can help ease the discomfort of GERD. WHAT GENERAL GUIDELINES DO I NEED TO FOLLOW?  Choose fruits, vegetables, whole grains, low-fat dairy products, and low-fat meat, fish, and poultry.  Limit fats such as oils, salad dressings, butter, nuts, and avocado.  Keep a food diary to identify foods that cause symptoms.  Avoid foods that cause reflux. These may be different for different people.  Eat frequent small meals instead of three large meals each day.  Eat your meals slowly, in a relaxed setting.  Limit fried foods.  Cook foods using methods other than frying.  Avoid drinking alcohol.  Avoid drinking large amounts of liquids with your meals.  Avoid bending over or lying down until 2-3 hours after eating. WHAT FOODS ARE NOT RECOMMENDED? The following are some foods and drinks that may worsen your symptoms: Vegetables Tomatoes. Tomato juice. Tomato and spaghetti sauce. Chili peppers. Onion and garlic. Horseradish. Fruits Oranges, grapefruit, and lemon (fruit and juice). Meats High-fat meats, fish, and poultry. This includes hot dogs, ribs, ham, sausage, salami, and bacon. Dairy Whole milk and chocolate milk. Sour cream. Cream. Butter. Ice cream. Cream cheese.  Beverages Coffee and tea, with or without caffeine. Carbonated beverages or energy drinks. Condiments Hot sauce. Barbecue sauce.  Sweets/Desserts Chocolate and cocoa. Donuts. Peppermint and spearmint. Fats and Oils High-fat foods, including French fries and potato chips. Other Vinegar. Strong spices, such as black pepper, white pepper, red pepper, cayenne, curry powder, cloves, ginger, and chili powder. The items listed above may not be a complete list of foods and beverages to avoid. Contact your dietitian for more  information.   This information is not intended to replace advice given to you by your health care provider. Make sure you discuss any questions you have with your health care provider.   Document Released: 04/06/2005 Document Revised: 04/27/2014 Document Reviewed: 02/08/2013 Elsevier Interactive Patient Education 2016 Elsevier Inc.  

## 2016-01-15 NOTE — Progress Notes (Signed)
   Subjective:    Patient ID: April Kane, female    DOB: July 06, 1960, 55 y.o.   MRN: [id number]  HPI    Review of Systems     Objective:   Physical Exam        Assessment & Plan:

## 2016-01-16 LAB — CBC WITH DIFFERENTIAL/PLATELET
Basophils Absolute: 0 10*3/uL (ref 0.0–0.2)
Basos: 0 %
EOS (ABSOLUTE): 0.3 10*3/uL (ref 0.0–0.4)
EOS: 4 %
HEMATOCRIT: 41.5 % (ref 34.0–46.6)
Hemoglobin: 13.6 g/dL (ref 11.1–15.9)
IMMATURE GRANULOCYTES: 0 %
Immature Grans (Abs): 0 10*3/uL (ref 0.0–0.1)
Lymphocytes Absolute: 1.7 10*3/uL (ref 0.7–3.1)
Lymphs: 27 %
MCH: 29.1 pg (ref 26.6–33.0)
MCHC: 32.8 g/dL (ref 31.5–35.7)
MCV: 89 fL (ref 79–97)
MONOS ABS: 0.4 10*3/uL (ref 0.1–0.9)
Monocytes: 7 %
NEUTROS PCT: 62 %
Neutrophils Absolute: 4 10*3/uL (ref 1.4–7.0)
PLATELETS: 281 10*3/uL (ref 150–379)
RBC: 4.67 x10E6/uL (ref 3.77–5.28)
RDW: 13.9 % (ref 12.3–15.4)
WBC: 6.5 10*3/uL (ref 3.4–10.8)

## 2016-01-16 LAB — CMP14+EGFR
ALK PHOS: 91 IU/L (ref 39–117)
ALT: 23 IU/L (ref 0–32)
AST: 20 IU/L (ref 0–40)
Albumin/Globulin Ratio: 1.7 (ref 1.2–2.2)
Albumin: 4.2 g/dL (ref 3.5–5.5)
BUN/Creatinine Ratio: 10 (ref 9–23)
BUN: 8 mg/dL (ref 6–24)
Bilirubin Total: 0.2 mg/dL (ref 0.0–1.2)
CO2: 30 mmol/L — AB (ref 18–29)
CREATININE: 0.83 mg/dL (ref 0.57–1.00)
Calcium: 9.1 mg/dL (ref 8.7–10.2)
Chloride: 101 mmol/L (ref 96–106)
GFR calc Af Amer: 93 mL/min/{1.73_m2} (ref >59)
GFR calc non Af Amer: 81 mL/min/{1.73_m2} (ref >59)
GLOBULIN, TOTAL: 2.5 g/dL (ref 1.5–4.5)
GLUCOSE: 102 mg/dL — AB (ref 65–99)
Potassium: 3.7 mmol/L (ref 3.5–5.2)
SODIUM: 145 mmol/L — AB (ref 134–144)
Total Protein: 6.7 g/dL (ref 6.0–8.5)

## 2016-01-16 LAB — LIPID PANEL
CHOLESTEROL TOTAL: 198 mg/dL (ref 100–199)
Chol/HDL Ratio: 4.8 ratio units — ABNORMAL HIGH (ref 0.0–4.4)
HDL: 41 mg/dL (ref >39)
LDL Calculated: 123 mg/dL — ABNORMAL HIGH (ref 0–99)
TRIGLYCERIDES: 168 mg/dL — AB (ref 0–149)
VLDL Cholesterol Cal: 34 mg/dL (ref 5–40)

## 2016-01-20 ENCOUNTER — Other Ambulatory Visit: Payer: Self-pay | Admitting: Family

## 2016-01-20 LAB — PAP IG W/ RFLX HPV ASCU: PAP Smear Comment: 0

## 2016-01-20 MED ORDER — ATORVASTATIN CALCIUM 20 MG PO TABS: 20.0000 mg | ORAL_TABLET | Freq: Every day | ORAL | 3 refills | Status: DC

## 2016-01-21 ENCOUNTER — Telehealth: Payer: Self-pay | Admitting: Family

## 2016-01-21 NOTE — Telephone Encounter (Signed)
Patient aware of lab results.

## 2016-02-11 ENCOUNTER — Encounter: Payer: 59 | Admitting: *Deleted

## 2016-03-09 ENCOUNTER — Other Ambulatory Visit: Payer: Self-pay | Admitting: Family

## 2016-03-23 ENCOUNTER — Other Ambulatory Visit: Payer: Self-pay | Admitting: Family

## 2016-03-23 NOTE — Telephone Encounter (Signed)
Patient last seen 01/15/16.  Please advise and route to pools.

## 2016-06-19 ENCOUNTER — Other Ambulatory Visit: Payer: Self-pay | Admitting: Family

## 2016-06-25 ENCOUNTER — Telehealth: Payer: Self-pay | Admitting: Family

## 2016-06-25 ENCOUNTER — Ambulatory Visit (INDEPENDENT_AMBULATORY_CARE_PROVIDER_SITE_OTHER): Payer: 59 | Admitting: Family Medicine

## 2016-06-25 ENCOUNTER — Encounter: Payer: Self-pay | Admitting: Family Medicine

## 2016-06-25 VITALS — BP 121/84 | HR 70 | Temp 97.2°F | Ht 68.0 in | Wt 158.0 lb

## 2016-06-25 DIAGNOSIS — J0121 Acute recurrent ethmoidal sinusitis: Secondary | ICD-10-CM

## 2016-06-25 MED ORDER — AMOXICILLIN-POT CLAVULANATE 875-125 MG PO TABS: 1.0000 | ORAL_TABLET | Freq: Two times a day (BID) | ORAL | 0 refills | Status: DC

## 2016-06-25 NOTE — Telephone Encounter (Signed)
appt scheduled Pt notified 

## 2016-06-25 NOTE — Telephone Encounter (Signed)
What symptoms do you have? Ear ache, sinus, dizzy, she threw up yesterday, she can feel discharge from sinuses in throat  How long have you been sick? Since Yesterday she has been using flonase   Have you been seen for this problem? Yes in January thinks she has another sinus infection  If your provider decides to give you a prescription, which pharmacy would you like for it to be sent to? [facility] [place]   Patient informed that this information will be sent to the clinical staff for review and that they should receive a follow up call.

## 2016-06-25 NOTE — Progress Notes (Signed)
BP 121/84   Pulse 70   Temp 97.2 F (36.2 C) (Oral)   Ht 5\' 8"  (1.727 m)   Wt 158 lb (71.7 kg)   LMP 04/20/2013   BMI 24.02 kg/m    Subjective:    Patient ID: April Kane, female    DOB: [birth year], 56 y.o.   MRN: [id number]  HPI: April BambergDeb Furey is a 56 y.o. female presenting on 06/25/2016 for Sinusitis (sinus congestion, drainage, sore throat, bilateral ear pain; began yesterday)   HPI Sinus congestion and dizziness and headache and ear pressure Patient comes in today with sinus congestion and dizziness and headache and ear pressure that going on for the past day and a half. She denies any fevers or chills or shortness of breath or wheezing. She denies any sick contacts that she knows of. She is still smoking and she gets like this about twice a year at least. She says a lot of her pressure right now is in the ethmoid sinuses near her nasal bridge. She has been having postnasal drainage and a cough and a sore throat as well with this. She feels like it is worsening and the cough is keeping her up at night. She has used Tylenol and Mucinex which have helped a little but not much.  Relevant past medical, surgical, family and social history reviewed and updated as indicated. Interim medical history since our last visit reviewed. Allergies and medications reviewed and updated.  Review of Systems  Constitutional: Negative for chills and fever.  HENT: Positive for congestion, postnasal drip, rhinorrhea, sinus pressure, sneezing and sore throat. Negative for ear discharge and ear pain.   Eyes: Negative for pain, redness and visual disturbance.  Respiratory: Positive for cough. Negative for chest tightness and shortness of breath.   Cardiovascular: Negative for chest pain and leg swelling.  Genitourinary: Negative for difficulty urinating and dysuria.  Musculoskeletal: Negative for back pain and gait problem.  Skin: Negative for rash.  Neurological: Negative for light-headedness and headaches.    Psychiatric/Behavioral: Negative for agitation and behavioral problems.  All other systems reviewed and are negative.   Per HPI unless specifically indicated above   Allergies as of 06/25/2016   No Known Allergies     Medication List       Accurate as of 06/25/16 11:48 AM. Always use your most recent med list.          amitriptyline 100 MG tablet Commonly known as:  ELAVIL TAKE ONE TABLET BY MOUTH ONCE DAILY AT BEDTIME   amoxicillin-clavulanate 875-125 MG tablet Commonly known as:  AUGMENTIN Take 1 tablet by mouth 2 (two) times daily.   atenolol 50 MG tablet Commonly known as:  TENORMIN Take 1 Tablet by mouth once daily   atorvastatin 20 MG tablet Commonly known as:  LIPITOR Take 1 tablet (20 mg total) by mouth daily.   fluticasone 50 MCG/ACT nasal spray Commonly known as:  FLONASE Place 2 sprays into both nostrils daily.   hydrochlorothiazide 25 MG tablet Commonly known as:  HYDRODIURIL TAKE ONE TABLET BY MOUTH ONCE DAILY   omeprazole 20 MG capsule Commonly known as:  PRILOSEC Take 1 capsule (20 mg total) by mouth daily.          Objective:    BP 121/84   Pulse 70   Temp 97.2 F (36.2 C) (Oral)   Ht 5\' 8"  (1.727 m)   Wt 158 lb (71.7 kg)   LMP 04/20/2013   BMI 24.02 kg/m   Wt  Readings from Last 3 Encounters:  06/25/16 158 lb (71.7 kg)  01/15/16 161 lb 9.6 oz (73.3 kg)  11/07/15 158 lb 12.8 oz (72 kg)    Physical Exam  Constitutional: She is oriented to person, place, and time. She appears well-developed and well-nourished. No distress.  HENT:  Right Ear: Tympanic membrane, external ear and ear canal normal.  Left Ear: Tympanic membrane, external ear and ear canal normal.  Nose: Mucosal edema and rhinorrhea present. No epistaxis. Right sinus exhibits no maxillary sinus tenderness and no frontal sinus tenderness. Left sinus exhibits no maxillary sinus tenderness and no frontal sinus tenderness.  Mouth/Throat: Uvula is midline and mucous membranes  are normal. Posterior oropharyngeal edema present. No oropharyngeal exudate, posterior oropharyngeal erythema or tonsillar abscesses.  Ethmoid and nasal bridge sinus pressure.  Eyes: Conjunctivae are normal.  Cardiovascular: Normal rate, regular rhythm, normal heart sounds and intact distal pulses.   No murmur heard. Pulmonary/Chest: Effort normal and breath sounds normal. No respiratory distress. She has no wheezes. She has no rales.  Musculoskeletal: Normal range of motion. She exhibits no edema or tenderness.  Neurological: She is alert and oriented to person, place, and time. Coordination normal.  Skin: Skin is warm and dry. No rash noted. She is not diaphoretic.  Psychiatric: She has a normal mood and affect. Her behavior is normal.  Vitals reviewed.     Assessment & Plan:   Problem List Items Addressed This Visit    None    Visit Diagnoses    Acute recurrent ethmoidal sinusitis    -  Primary   Relevant Medications   amoxicillin-clavulanate (AUGMENTIN) 875-125 MG tablet       Follow up plan: Return if symptoms worsen or fail to improve.  Counseling provided for all of the vaccine components No orders of the defined types were placed in this encounter.   Arville Care, MD [facility] 06/25/2016, 11:48 AM

## 2016-06-29 ENCOUNTER — Other Ambulatory Visit: Payer: Self-pay | Admitting: Family

## 2016-07-29 ENCOUNTER — Other Ambulatory Visit: Payer: Self-pay | Admitting: Family

## 2016-09-06 ENCOUNTER — Other Ambulatory Visit: Payer: Self-pay | Admitting: Family

## 2016-09-21 ENCOUNTER — Other Ambulatory Visit: Payer: Self-pay | Admitting: Family

## 2016-12-07 ENCOUNTER — Other Ambulatory Visit: Payer: Self-pay | Admitting: Family

## 2016-12-08 NOTE — Telephone Encounter (Signed)
Patient NTBS for follow up and lab work  

## 2016-12-08 NOTE — Telephone Encounter (Signed)
Lt seen for BP 12/2015

## 2016-12-21 ENCOUNTER — Other Ambulatory Visit: Payer: Self-pay | Admitting: Family

## 2016-12-29 ENCOUNTER — Other Ambulatory Visit: Payer: Self-pay | Admitting: Family Medicine

## 2017-01-10 ENCOUNTER — Other Ambulatory Visit: Payer: Self-pay | Admitting: Family

## 2017-01-12 ENCOUNTER — Telehealth: Payer: Self-pay | Admitting: Family

## 2017-01-12 NOTE — Telephone Encounter (Signed)
April Kane sent in a refill on 01/11/17 to [facility], patient advised and informed she needs an appointment before any more refills.  Appointment made on 01/28/17 at 8:40 am.

## 2017-01-25 ENCOUNTER — Other Ambulatory Visit: Payer: Self-pay | Admitting: Family

## 2017-01-25 DIAGNOSIS — K219 Gastro-esophageal reflux disease without esophagitis: Secondary | ICD-10-CM

## 2017-01-28 ENCOUNTER — Ambulatory Visit: Payer: 59 | Admitting: Family

## 2017-02-01 ENCOUNTER — Other Ambulatory Visit: Payer: Self-pay | Admitting: Family Medicine

## 2017-02-04 ENCOUNTER — Ambulatory Visit (INDEPENDENT_AMBULATORY_CARE_PROVIDER_SITE_OTHER): Payer: 59 | Admitting: Family

## 2017-02-04 ENCOUNTER — Encounter: Payer: Self-pay | Admitting: Family

## 2017-02-04 ENCOUNTER — Other Ambulatory Visit: Payer: Self-pay | Admitting: Family

## 2017-02-04 VITALS — BP 136/86 | HR 72 | Temp 98.2°F | Ht 68.0 in | Wt 165.2 lb

## 2017-02-04 DIAGNOSIS — I10 Essential (primary) hypertension: Secondary | ICD-10-CM

## 2017-02-04 DIAGNOSIS — Z0000 Encounter for general adult medical examination without abnormal findings: Secondary | ICD-10-CM | POA: Diagnosis not present

## 2017-02-04 DIAGNOSIS — M797 Fibromyalgia: Secondary | ICD-10-CM

## 2017-02-04 DIAGNOSIS — E785 Hyperlipidemia, unspecified: Secondary | ICD-10-CM

## 2017-02-04 DIAGNOSIS — E559 Vitamin D deficiency, unspecified: Secondary | ICD-10-CM

## 2017-02-04 DIAGNOSIS — K219 Gastro-esophageal reflux disease without esophagitis: Secondary | ICD-10-CM

## 2017-02-04 DIAGNOSIS — Z1211 Encounter for screening for malignant neoplasm of colon: Secondary | ICD-10-CM

## 2017-02-04 MED ORDER — OMEPRAZOLE 20 MG PO CPDR: 20.0000 mg | DELAYED_RELEASE_CAPSULE | Freq: Every day | ORAL | 3 refills | Status: DC

## 2017-02-04 MED ORDER — ATENOLOL 50 MG PO TABS: 50.0000 mg | ORAL_TABLET | Freq: Every day | ORAL | 2 refills | Status: DC

## 2017-02-04 MED ORDER — AMITRIPTYLINE HCL 100 MG PO TABS: 100.0000 mg | ORAL_TABLET | Freq: Every day | ORAL | 2 refills | Status: DC

## 2017-02-04 NOTE — Progress Notes (Signed)
Subjective:    Patient ID: April Kane, female    DOB: [birth year], 56 y.o.   MRN: [id number]  Pt presents to the office today for CPE. Pt states she quit smoking in April!  Hypertension  This is a chronic problem. The current episode started more than 1 year ago. The problem has been resolved since onset. The problem is controlled. Pertinent negatives include no headaches, malaise/fatigue, peripheral edema or shortness of breath. Risk factors for coronary artery disease include dyslipidemia and sedentary lifestyle. The current treatment provides moderate improvement. There is no history of kidney disease, CAD/MI or heart failure.  Hyperlipidemia  This is a chronic problem. The current episode started more than 1 year ago. The problem is uncontrolled. Recent lipid tests were reviewed and are high. Pertinent negatives include no shortness of breath. Current antihyperlipidemic treatment includes diet change. The current treatment provides no improvement of lipids. Risk factors for coronary artery disease include dyslipidemia, obesity, post-menopausal and a sedentary lifestyle.  Gastroesophageal Reflux  She reports no belching, no coughing or no heartburn. This is a chronic problem. The current episode started more than 1 year ago. The problem occurs rarely. The problem has been waxing and waning. She has tried a PPI for the symptoms. The treatment provided moderate relief.  Fibromyalgia Pt takes amitriptyline 100 mg at bedtime. Stable.     Review of Systems  Constitutional: Negative for malaise/fatigue.  Respiratory: Negative for cough and shortness of breath.   Gastrointestinal: Negative for heartburn.  Neurological: Negative for headaches.  All other systems reviewed and are negative.      Objective:   Physical Exam  Constitutional: She is oriented to person, place, and time. She appears well-developed and well-nourished. No distress.  HENT:  Head: Normocephalic and atraumatic.  Right  Ear: External ear normal.  Left Ear: External ear normal.  Nose: Nose normal.  Mouth/Throat: Oropharynx is clear and moist.  Eyes: Pupils are equal, round, and reactive to light.  Neck: Normal range of motion. Neck supple. No thyromegaly present.  Cardiovascular: Normal rate, regular rhythm, normal heart sounds and intact distal pulses.   No murmur heard. Pulmonary/Chest: Effort normal and breath sounds normal. No respiratory distress. She has no wheezes.  Abdominal: Soft. Bowel sounds are normal. She exhibits no distension. There is no tenderness.  Musculoskeletal: Normal range of motion. She exhibits no edema or tenderness.  Neurological: She is alert and oriented to person, place, and time.  Skin: Skin is warm and dry.  Psychiatric: She has a normal mood and affect. Her behavior is normal. Judgment and thought content normal.  Vitals reviewed.     BP 136/86   Pulse 72   Temp 98.2 F (36.8 C) (Oral)   Ht _0  (1.727 m)   Wt 165 lb 3.2 oz (74.9 kg)   LMP 04/20/2013   BMI 25.12 kg/m      Assessment & Plan:  1. Annual physical exam - CMP14+EGFR - CBC with Differential/Platelet - Lipid panel - TSH - VITAMIN D 25 Hydroxy (Vit-D Deficiency, Fractures)  2. Essential hypertension - CMP14+EGFR - CBC with Differential/Platelet - atenolol (TENORMIN) 50 MG tablet; Take 1 tablet (50 mg total) by mouth daily.  Dispense: 90 tablet; Refill: 2  3. Hyperlipidemia, unspecified hyperlipidemia type - CMP14+EGFR - CBC with Differential/Platelet - Lipid panel  4. Gastroesophageal reflux disease, esophagitis presence not specified  - CMP14+EGFR - CBC with Differential/Platelet - omeprazole (PRILOSEC) 20 MG capsule; Take 1 capsule (20 mg total) by mouth daily.  Dispense: 90 capsule; Refill: 3  5. Vitamin D deficiency - CMP14+EGFR - CBC with Differential/Platelet - VITAMIN D 25 Hydroxy (Vit-D Deficiency, Fractures)  6. Fibromyalgia - CMP14+EGFR - CBC with  Differential/Platelet - amitriptyline (ELAVIL) 100 MG tablet; Take 1 tablet (100 mg total) by mouth at bedtime.  Dispense: 90 tablet; Refill: 2  7. Colon cancer screening - Ambulatory referral to Gastroenterology   Continue all meds Labs pending Health Maintenance reviewed Diet and exercise encouraged RTO 6 months   Evelina Dun, FNP

## 2017-02-04 NOTE — Patient Instructions (Signed)

## 2017-02-05 ENCOUNTER — Other Ambulatory Visit: Payer: Self-pay | Admitting: Family

## 2017-02-05 ENCOUNTER — Encounter (INDEPENDENT_AMBULATORY_CARE_PROVIDER_SITE_OTHER): Payer: Self-pay | Admitting: *Deleted

## 2017-02-05 LAB — CMP14+EGFR
ALBUMIN: 4.3 g/dL (ref 3.5–5.5)
ALK PHOS: 81 IU/L (ref 39–117)
ALT: 27 IU/L (ref 0–32)
AST: 20 IU/L (ref 0–40)
Albumin/Globulin Ratio: 1.7 (ref 1.2–2.2)
BUN / CREAT RATIO: 6 — AB (ref 9–23)
BUN: 6 mg/dL (ref 6–24)
Bilirubin Total: 0.3 mg/dL (ref 0.0–1.2)
CALCIUM: 9.5 mg/dL (ref 8.7–10.2)
CO2: 30 mmol/L — AB (ref 20–29)
CREATININE: 0.98 mg/dL (ref 0.57–1.00)
Chloride: 100 mmol/L (ref 96–106)
GFR calc Af Amer: 76 mL/min/{1.73_m2} (ref >59)
GFR, EST NON AFRICAN AMERICAN: 66 mL/min/{1.73_m2} (ref >59)
GLOBULIN, TOTAL: 2.6 g/dL (ref 1.5–4.5)
GLUCOSE: 89 mg/dL (ref 65–99)
Potassium: 3.6 mmol/L (ref 3.5–5.2)
SODIUM: 145 mmol/L — AB (ref 134–144)
Total Protein: 6.9 g/dL (ref 6.0–8.5)

## 2017-02-05 LAB — CBC WITH DIFFERENTIAL/PLATELET
BASOS: 0 %
Basophils Absolute: 0 10*3/uL (ref 0.0–0.2)
EOS (ABSOLUTE): 0.3 10*3/uL (ref 0.0–0.4)
EOS: 6 %
HEMOGLOBIN: 14 g/dL (ref 11.1–15.9)
Hematocrit: 43.6 % (ref 34.0–46.6)
IMMATURE GRANULOCYTES: 0 %
Immature Grans (Abs): 0 10*3/uL (ref 0.0–0.1)
LYMPHS ABS: 1.9 10*3/uL (ref 0.7–3.1)
Lymphs: 35 %
MCH: 28.5 pg (ref 26.6–33.0)
MCHC: 32.1 g/dL (ref 31.5–35.7)
MCV: 89 fL (ref 79–97)
MONOCYTES: 5 %
MONOS ABS: 0.3 10*3/uL (ref 0.1–0.9)
NEUTROS PCT: 54 %
Neutrophils Absolute: 2.9 10*3/uL (ref 1.4–7.0)
Platelets: 311 10*3/uL (ref 150–379)
RBC: 4.92 x10E6/uL (ref 3.77–5.28)
RDW: 13.7 % (ref 12.3–15.4)
WBC: 5.4 10*3/uL (ref 3.4–10.8)

## 2017-02-05 LAB — LIPID PANEL
CHOL/HDL RATIO: 4.6 ratio — AB (ref 0.0–4.4)
Cholesterol, Total: 172 mg/dL (ref 100–199)
HDL: 37 mg/dL — AB (ref >39)
LDL CALC: 107 mg/dL — AB (ref 0–99)
TRIGLYCERIDES: 140 mg/dL (ref 0–149)
VLDL Cholesterol Cal: 28 mg/dL (ref 5–40)

## 2017-02-05 LAB — VITAMIN D 25 HYDROXY (VIT D DEFICIENCY, FRACTURES): VIT D 25 HYDROXY: 18.7 ng/mL — AB (ref 30.0–100.0)

## 2017-02-05 LAB — TSH: TSH: 3.34 u[IU]/mL (ref 0.450–4.500)

## 2017-02-05 MED ORDER — VITAMIN D (ERGOCALCIFEROL) 1.25 MG (50000 UNIT) PO CAPS: 50000.0000 [IU] | ORAL_CAPSULE | ORAL | 3 refills | Status: DC

## 2017-08-16 ENCOUNTER — Other Ambulatory Visit: Payer: Self-pay | Admitting: Family

## 2017-11-15 ENCOUNTER — Other Ambulatory Visit: Payer: Self-pay | Admitting: Family

## 2017-11-29 ENCOUNTER — Other Ambulatory Visit: Payer: Self-pay | Admitting: Family

## 2017-11-29 DIAGNOSIS — I10 Essential (primary) hypertension: Secondary | ICD-10-CM

## 2017-11-29 NOTE — Telephone Encounter (Signed)
Last seen 02/04/18

## 2017-12-13 ENCOUNTER — Other Ambulatory Visit: Payer: Self-pay | Admitting: Family

## 2017-12-13 NOTE — Telephone Encounter (Signed)
Last seen 02/04/17  April Kane 

## 2017-12-27 ENCOUNTER — Other Ambulatory Visit: Payer: Self-pay | Admitting: Family

## 2017-12-27 DIAGNOSIS — M797 Fibromyalgia: Secondary | ICD-10-CM

## 2018-01-16 ENCOUNTER — Other Ambulatory Visit: Payer: Self-pay | Admitting: Family

## 2018-01-17 NOTE — Telephone Encounter (Signed)
Last seen 02/04/18  Christy 

## 2018-01-24 ENCOUNTER — Other Ambulatory Visit: Payer: Self-pay | Admitting: Family

## 2018-01-24 DIAGNOSIS — M797 Fibromyalgia: Secondary | ICD-10-CM

## 2018-01-24 MED ORDER — AMITRIPTYLINE HCL 100 MG PO TABS: 100.0000 mg | ORAL_TABLET | Freq: Every day | ORAL | 0 refills | Status: DC

## 2018-01-24 NOTE — Telephone Encounter (Signed)
Apt made - 10/31- would like to know if Neysa Bonito can refill her amitriptyline until she comes in? If approved sent to [facility].

## 2018-01-24 NOTE — Telephone Encounter (Signed)
Prescription sent to pharmacy.

## 2018-01-24 NOTE — Addendum Note (Signed)
Addended by: Jannifer Rodney A on: 01/24/2018 01:23 PM   Modules accepted: Orders

## 2018-01-24 NOTE — Telephone Encounter (Signed)
Last seen 02/04/18  April Kane  Needs to be seen

## 2018-02-17 ENCOUNTER — Ambulatory Visit: Payer: 59 | Admitting: Family

## 2018-02-17 ENCOUNTER — Encounter: Payer: Self-pay | Admitting: Family

## 2018-02-17 VITALS — BP 123/83 | HR 72 | Temp 98.3°F | Ht 68.0 in | Wt 169.2 lb

## 2018-02-17 DIAGNOSIS — E559 Vitamin D deficiency, unspecified: Secondary | ICD-10-CM

## 2018-02-17 DIAGNOSIS — Z0000 Encounter for general adult medical examination without abnormal findings: Secondary | ICD-10-CM | POA: Diagnosis not present

## 2018-02-17 DIAGNOSIS — I10 Essential (primary) hypertension: Secondary | ICD-10-CM

## 2018-02-17 DIAGNOSIS — E663 Overweight: Secondary | ICD-10-CM | POA: Insufficient documentation

## 2018-02-17 DIAGNOSIS — E785 Hyperlipidemia, unspecified: Secondary | ICD-10-CM

## 2018-02-17 DIAGNOSIS — Z1211 Encounter for screening for malignant neoplasm of colon: Secondary | ICD-10-CM

## 2018-02-17 DIAGNOSIS — M797 Fibromyalgia: Secondary | ICD-10-CM

## 2018-02-17 DIAGNOSIS — Z1212 Encounter for screening for malignant neoplasm of rectum: Secondary | ICD-10-CM

## 2018-02-17 DIAGNOSIS — K219 Gastro-esophageal reflux disease without esophagitis: Secondary | ICD-10-CM

## 2018-02-17 MED ORDER — OMEPRAZOLE 20 MG PO CPDR: 20.0000 mg | DELAYED_RELEASE_CAPSULE | Freq: Every day | ORAL | 3 refills | Status: DC

## 2018-02-17 MED ORDER — ATENOLOL 50 MG PO TABS: 50.0000 mg | ORAL_TABLET | Freq: Every day | ORAL | 3 refills | Status: DC

## 2018-02-17 MED ORDER — AMITRIPTYLINE HCL 100 MG PO TABS: 100.0000 mg | ORAL_TABLET | Freq: Every day | ORAL | 3 refills | Status: DC

## 2018-02-17 MED ORDER — HYDROCHLOROTHIAZIDE 25 MG PO TABS: 25.0000 mg | ORAL_TABLET | Freq: Every day | ORAL | 3 refills | Status: DC

## 2018-02-17 NOTE — Progress Notes (Signed)
Subjective:    Patient ID: April Kane, female    DOB: [birth year], 57 y.o.   MRN: [id number]  Chief Complaint  Patient presents with  . Medical Management of Chronic Issues    refills    Pt presents to the office today for CPE. Hypertension  This is a chronic problem. The current episode started more than 1 year ago. The problem has been resolved since onset. The problem is controlled. Pertinent negatives include no chest pain, malaise/fatigue, peripheral edema or shortness of breath. The current treatment provides moderate improvement. There is no history of kidney disease or heart failure.  Gastroesophageal Reflux  She complains of heartburn. She reports no abdominal pain, no chest pain or no coughing. This is a chronic problem. The current episode started more than 1 year ago. The problem occurs rarely. The problem has been waxing and waning. She has tried a PPI for the symptoms. The treatment provided moderate relief.  Hyperlipidemia  This is a chronic problem. The current episode started more than 1 year ago. The problem is controlled. Recent lipid tests were reviewed and are normal. Pertinent negatives include no chest pain or shortness of breath. Current antihyperlipidemic treatment includes diet change. The current treatment provides moderate improvement of lipids. Risk factors for coronary artery disease include dyslipidemia, hypertension and a sedentary lifestyle.  Fibromyalgia PT takes amitriptyline. States she is doing well with this.    Review of Systems  Constitutional: Negative for malaise/fatigue.  Respiratory: Negative for cough and shortness of breath.   Cardiovascular: Negative for chest pain.  Gastrointestinal: Positive for heartburn. Negative for abdominal pain.  All other systems reviewed and are negative.  Family History  Problem Relation Age of Onset  . Hypertension Mother   . Cancer Father        lung    Social History   Socioeconomic History  . Marital  status: Divorced    Spouse name: Not on file  . Number of children: Not on file  . Years of education: Not on file  . Highest education level: Not on file  Occupational History  . Not on file  Social Needs  . Financial resource strain: Not on file  . Food insecurity:    Worry: Not on file    Inability: Not on file  . Transportation needs:    Medical: Not on file    Non-medical: Not on file  Tobacco Use  . Smoking status: Former Smoker    Last attempt to quit: 08/05/2016    Years since quitting: 1.5  . Smokeless tobacco: Never Used  Substance and Sexual Activity  . Alcohol use: No    Alcohol/week: 0.0 standard drinks  . Drug use: No  . Sexual activity: Not on file  Lifestyle  . Physical activity:    Days per week: Not on file    Minutes per session: Not on file  . Stress: Not on file  Relationships  . Social connections:    Talks on phone: Not on file    Gets together: Not on file    Attends religious service: Not on file    Active member of club or organization: Not on file    Attends meetings of clubs or organizations: Not on file    Relationship status: Not on file  Other Topics Concern  . Not on file  Social History Narrative  . Not on file       Objective:   Physical Exam  Constitutional: She is oriented  to person, place, and time. She appears well-developed and well-nourished. No distress.  HENT:  Head: Normocephalic and atraumatic.  Right Ear: External ear normal.  Left Ear: External ear normal.  Mouth/Throat: Oropharynx is clear and moist.  Eyes: Pupils are equal, round, and reactive to light.  Neck: Normal range of motion. Neck supple. No thyromegaly present.  Cardiovascular: Normal rate, regular rhythm, normal heart sounds and intact distal pulses.  No murmur heard. Pulmonary/Chest: Effort normal and breath sounds normal. No respiratory distress. She has no wheezes.  Abdominal: Soft. Bowel sounds are normal. She exhibits no distension. There is no  tenderness.  Musculoskeletal: Normal range of motion. She exhibits no edema or tenderness.  Neurological: She is alert and oriented to person, place, and time. She has normal reflexes. No cranial nerve deficit.  Skin: Skin is warm and dry.  Psychiatric: She has a normal mood and affect. Her behavior is normal. Judgment and thought content normal.  Vitals reviewed.     BP 123/83   Pulse 72   Temp 98.3 F (36.8 C) (Oral)   Ht '5\' 8"'  (1.727 m)   Wt 169 lb 3.2 oz (76.7 kg)   LMP 04/20/2013   BMI 25.73 kg/m      Assessment & Plan:  April Kane comes in today with chief complaint of Medical Management of Chronic Issues (refills) and Annual Exam   Diagnosis and orders addressed:  1. Annual physical exam - CMP14+EGFR - CBC with Differential/Platelet - Lipid panel - TSH  2. Essential hypertension - CMP14+EGFR - CBC with Differential/Platelet - atenolol (TENORMIN) 50 MG tablet; Take 1 tablet (50 mg total) by mouth daily.  Dispense: 90 tablet; Refill: 3 - hydrochlorothiazide (HYDRODIURIL) 25 MG tablet; Take 1 tablet (25 mg total) by mouth daily.  Dispense: 90 tablet; Refill: 3  3. Gastroesophageal reflux disease, esophagitis presence not specified - CMP14+EGFR - CBC with Differential/Platelet - omeprazole (PRILOSEC) 20 MG capsule; Take 1 capsule (20 mg total) by mouth daily.  Dispense: 90 capsule; Refill: 3  4. Fibromyalgia - CMP14+EGFR - CBC with Differential/Platelet - amitriptyline (ELAVIL) 100 MG tablet; Take 1 tablet (100 mg total) by mouth at bedtime. (Needs to be seen)  Dispense: 90 tablet; Refill: 3  5. Hyperlipidemia, unspecified hyperlipidemia type - CMP14+EGFR - CBC with Differential/Platelet - Lipid panel  6. Vitamin D deficiency - CMP14+EGFR - CBC with Differential/Platelet  7. Overweight (BMI 25.0-29.9) - CMP14+EGFR - CBC with Differential/Platelet  8. Colon cancer screening - Cologuard - CMP14+EGFR - CBC with Differential/Platelet  9. Screening for  malignant neoplasm of the rectum - Cologuard - CMP14+EGFR - CBC with Differential/Platelet   Labs pending Health Maintenance reviewed- Pt will schedule mammogram Diet and exercise encouraged  Follow up plan: 6 months    Evelina Dun, FNP

## 2018-02-17 NOTE — Patient Instructions (Signed)

## 2018-02-18 LAB — CMP14+EGFR
ALT: 42 IU/L — AB (ref 0–32)
AST: 29 IU/L (ref 0–40)
Albumin/Globulin Ratio: 2.3 — ABNORMAL HIGH (ref 1.2–2.2)
Albumin: 4.5 g/dL (ref 3.5–5.5)
Alkaline Phosphatase: 82 IU/L (ref 39–117)
BUN/Creatinine Ratio: 7 — ABNORMAL LOW (ref 9–23)
BUN: 7 mg/dL (ref 6–24)
Bilirubin Total: 0.3 mg/dL (ref 0.0–1.2)
CALCIUM: 9.2 mg/dL (ref 8.7–10.2)
CO2: 26 mmol/L (ref 20–29)
CREATININE: 0.97 mg/dL (ref 0.57–1.00)
Chloride: 99 mmol/L (ref 96–106)
GFR calc Af Amer: 76 mL/min/{1.73_m2} (ref >59)
GFR, EST NON AFRICAN AMERICAN: 66 mL/min/{1.73_m2} (ref >59)
GLUCOSE: 102 mg/dL — AB (ref 65–99)
Globulin, Total: 2 g/dL (ref 1.5–4.5)
Potassium: 3.4 mmol/L — ABNORMAL LOW (ref 3.5–5.2)
Sodium: 142 mmol/L (ref 134–144)
Total Protein: 6.5 g/dL (ref 6.0–8.5)

## 2018-02-18 LAB — LIPID PANEL
CHOLESTEROL TOTAL: 200 mg/dL — AB (ref 100–199)
Chol/HDL Ratio: 4.8 ratio — ABNORMAL HIGH (ref 0.0–4.4)
HDL: 42 mg/dL (ref >39)
LDL Calculated: 136 mg/dL — ABNORMAL HIGH (ref 0–99)
Triglycerides: 111 mg/dL (ref 0–149)
VLDL Cholesterol Cal: 22 mg/dL (ref 5–40)

## 2018-02-18 LAB — CBC WITH DIFFERENTIAL/PLATELET
Basophils Absolute: 0 x10E3/uL (ref 0.0–0.2)
Basos: 0 %
EOS (ABSOLUTE): 0.3 x10E3/uL (ref 0.0–0.4)
Eos: 5 %
Hematocrit: 39.7 % (ref 34.0–46.6)
Hemoglobin: 13.2 g/dL (ref 11.1–15.9)
Immature Grans (Abs): 0 x10E3/uL (ref 0.0–0.1)
Immature Granulocytes: 0 %
Lymphocytes Absolute: 1.8 x10E3/uL (ref 0.7–3.1)
Lymphs: 32 %
MCH: 28.4 pg (ref 26.6–33.0)
MCHC: 33.2 g/dL (ref 31.5–35.7)
MCV: 85 fL (ref 79–97)
Monocytes Absolute: 0.4 x10E3/uL (ref 0.1–0.9)
Monocytes: 7 %
Neutrophils Absolute: 3.1 x10E3/uL (ref 1.4–7.0)
Neutrophils: 56 %
Platelets: 255 x10E3/uL (ref 150–450)
RBC: 4.65 x10E6/uL (ref 3.77–5.28)
RDW: 12.9 % (ref 12.3–15.4)
WBC: 5.5 x10E3/uL (ref 3.4–10.8)

## 2018-02-18 LAB — TSH: TSH: 2.4 u[IU]/mL (ref 0.450–4.500)

## 2018-08-26 ENCOUNTER — Telehealth: Payer: Self-pay | Admitting: Family

## 2019-02-16 ENCOUNTER — Other Ambulatory Visit: Payer: Self-pay | Admitting: Family

## 2019-02-16 DIAGNOSIS — I10 Essential (primary) hypertension: Secondary | ICD-10-CM

## 2019-02-16 DIAGNOSIS — K219 Gastro-esophageal reflux disease without esophagitis: Secondary | ICD-10-CM

## 2019-02-23 ENCOUNTER — Telehealth: Payer: Self-pay | Admitting: Family

## 2019-02-23 ENCOUNTER — Ambulatory Visit: Payer: 59 | Admitting: Family

## 2019-02-23 DIAGNOSIS — I10 Essential (primary) hypertension: Secondary | ICD-10-CM

## 2019-02-23 DIAGNOSIS — M797 Fibromyalgia: Secondary | ICD-10-CM

## 2019-02-23 NOTE — Telephone Encounter (Signed)
Please advise on refill.

## 2019-02-23 NOTE — Telephone Encounter (Signed)
What is the name of the medication?  April Kane (TENORMIN) 50 MG tablet amitriptyline (ELAVIL) 100 MG tablet   Have you contacted your pharmacy to request a refill? No had apt today but cancelled because provider called out.  Which pharmacy would you like this sent to? [facility]   Patient notified that their request is being sent to the clinical staff for review and that they should receive a call once it is complete. If they do not receive a call within 24 hours they can check with their pharmacy or our office.

## 2019-02-24 MED ORDER — AMITRIPTYLINE HCL 100 MG PO TABS: 100.0000 mg | ORAL_TABLET | Freq: Every day | ORAL | 0 refills | Status: DC

## 2019-02-24 MED ORDER — HYDROCHLOROTHIAZIDE 25 MG PO TABS: 25.0000 mg | ORAL_TABLET | Freq: Every day | ORAL | 0 refills | Status: DC

## 2019-02-24 MED ORDER — ATENOLOL 50 MG PO TABS: 50.0000 mg | ORAL_TABLET | Freq: Every day | ORAL | 0 refills | Status: DC

## 2019-02-24 NOTE — Telephone Encounter (Signed)
Patient aware, script is ready. 

## 2019-02-24 NOTE — Telephone Encounter (Signed)
Prescription sent to pharmacy for 30 days. Please make a follow up appt as soon as possible for lab work.

## 2019-03-09 ENCOUNTER — Ambulatory Visit: Payer: 59 | Admitting: Family

## 2019-03-22 ENCOUNTER — Other Ambulatory Visit: Payer: Self-pay

## 2019-03-23 ENCOUNTER — Ambulatory Visit: Payer: 59 | Admitting: Family

## 2019-03-23 ENCOUNTER — Encounter: Payer: Self-pay | Admitting: Family

## 2019-03-23 VITALS — BP 121/82 | HR 77 | Temp 98.4°F | Ht 68.0 in | Wt 175.0 lb

## 2019-03-23 DIAGNOSIS — M797 Fibromyalgia: Secondary | ICD-10-CM | POA: Diagnosis not present

## 2019-03-23 DIAGNOSIS — I10 Essential (primary) hypertension: Secondary | ICD-10-CM

## 2019-03-23 DIAGNOSIS — Z0000 Encounter for general adult medical examination without abnormal findings: Secondary | ICD-10-CM

## 2019-03-23 DIAGNOSIS — E785 Hyperlipidemia, unspecified: Secondary | ICD-10-CM

## 2019-03-23 DIAGNOSIS — E663 Overweight: Secondary | ICD-10-CM

## 2019-03-23 DIAGNOSIS — K219 Gastro-esophageal reflux disease without esophagitis: Secondary | ICD-10-CM | POA: Diagnosis not present

## 2019-03-23 DIAGNOSIS — Z1212 Encounter for screening for malignant neoplasm of rectum: Secondary | ICD-10-CM

## 2019-03-23 DIAGNOSIS — Z1211 Encounter for screening for malignant neoplasm of colon: Secondary | ICD-10-CM

## 2019-03-23 DIAGNOSIS — Z1239 Encounter for other screening for malignant neoplasm of breast: Secondary | ICD-10-CM

## 2019-03-23 DIAGNOSIS — E559 Vitamin D deficiency, unspecified: Secondary | ICD-10-CM

## 2019-03-23 DIAGNOSIS — Z0001 Encounter for general adult medical examination with abnormal findings: Secondary | ICD-10-CM

## 2019-03-23 MED ORDER — AMITRIPTYLINE HCL 100 MG PO TABS: 100.0000 mg | ORAL_TABLET | Freq: Every day | ORAL | 4 refills | Status: DC

## 2019-03-23 MED ORDER — OMEPRAZOLE 20 MG PO CPDR: 20.0000 mg | DELAYED_RELEASE_CAPSULE | Freq: Every day | ORAL | 4 refills | Status: DC

## 2019-03-23 MED ORDER — HYDROCHLOROTHIAZIDE 25 MG PO TABS: 25.0000 mg | ORAL_TABLET | Freq: Every day | ORAL | 4 refills | Status: DC

## 2019-03-23 MED ORDER — ATENOLOL 50 MG PO TABS: 50.0000 mg | ORAL_TABLET | Freq: Every day | ORAL | 4 refills | Status: DC

## 2019-03-23 NOTE — Patient Instructions (Signed)
Health Maintenance, Female Adopting a healthy lifestyle and getting preventive care are important in promoting health and wellness. Ask your health care provider about:  The right schedule for you to have regular tests and exams.  Things you can do on your own to prevent diseases and keep yourself healthy. What should I know about diet, weight, and exercise? Eat a healthy diet   Eat a diet that includes plenty of vegetables, fruits, low-fat dairy products, and lean protein.  Do not eat a lot of foods that are high in solid fats, added sugars, or sodium. Maintain a healthy weight Body mass index (BMI) is used to identify weight problems. It estimates body fat based on height and weight. Your health care provider can help determine your BMI and help you achieve or maintain a healthy weight. Get regular exercise Get regular exercise. This is one of the most important things you can do for your health. Most adults should:  Exercise for at least 150 minutes each week. The exercise should increase your heart rate and make you sweat (moderate-intensity exercise).  Do strengthening exercises at least twice a week. This is in addition to the moderate-intensity exercise.  Spend less time sitting. Even light physical activity can be beneficial. Watch cholesterol and blood lipids Have your blood tested for lipids and cholesterol at 56 years of age, then have this test every 5 years. Have your cholesterol levels checked more often if:  Your lipid or cholesterol levels are high.  You are older than 56 years of age.  You are at high risk for heart disease. What should I know about cancer screening? Depending on your health history and family history, you may need to have cancer screening at various ages. This may include screening for:  Breast cancer.  Cervical cancer.  Colorectal cancer.  Skin cancer.  Lung cancer. What should I know about heart disease, diabetes, and high blood  pressure? Blood pressure and heart disease  High blood pressure causes heart disease and increases the risk of stroke. This is more likely to develop in people who have high blood pressure readings, are of African descent, or are overweight.  Have your blood pressure checked: ? Every 3-5 years if you are 18-39 years of age. ? Every year if you are 40 years old or older. Diabetes Have regular diabetes screenings. This checks your fasting blood sugar level. Have the screening done:  Once every three years after age 40 if you are at a normal weight and have a low risk for diabetes.  More often and at a younger age if you are overweight or have a high risk for diabetes. What should I know about preventing infection? Hepatitis B If you have a higher risk for hepatitis B, you should be screened for this virus. Talk with your health care provider to find out if you are at risk for hepatitis B infection. Hepatitis C Testing is recommended for:  Everyone born from 1945 through 1965.  Anyone with known risk factors for hepatitis C. Sexually transmitted infections (STIs)  Get screened for STIs, including gonorrhea and chlamydia, if: ? You are sexually active and are younger than 56 years of age. ? You are older than 56 years of age and your health care provider tells you that you are at risk for this type of infection. ? Your sexual activity has changed since you were last screened, and you are at increased risk for chlamydia or gonorrhea. Ask your health care provider if   you are at risk.  Ask your health care provider about whether you are at high risk for HIV. Your health care provider may recommend a prescription medicine to help prevent HIV infection. If you choose to take medicine to prevent HIV, you should first get tested for HIV. You should then be tested every 3 months for as long as you are taking the medicine. Pregnancy  If you are about to stop having your period (premenopausal) and  you may become pregnant, seek counseling before you get pregnant.  Take 400 to 800 micrograms (mcg) of folic acid every day if you become pregnant.  Ask for birth control (contraception) if you want to prevent pregnancy. Osteoporosis and menopause Osteoporosis is a disease in which the bones lose minerals and strength with aging. This can result in bone fractures. If you are 65 years old or older, or if you are at risk for osteoporosis and fractures, ask your health care provider if you should:  Be screened for bone loss.  Take a calcium or vitamin D supplement to lower your risk of fractures.  Be given hormone replacement therapy (HRT) to treat symptoms of menopause. Follow these instructions at home: Lifestyle  Do not use any products that contain nicotine or tobacco, such as cigarettes, e-cigarettes, and chewing tobacco. If you need help quitting, ask your health care provider.  Do not use street drugs.  Do not share needles.  Ask your health care provider for help if you need support or information about quitting drugs. Alcohol use  Do not drink alcohol if: ? Your health care provider tells you not to drink. ? You are pregnant, may be pregnant, or are planning to become pregnant.  If you drink alcohol: ? Limit how much you use to 0-1 drink a day. ? Limit intake if you are breastfeeding.  Be aware of how much alcohol is in your drink. In the [place] one drink equals one 12 oz bottle of beer (355 mL), one 5 oz glass of wine (148 mL), or one 1 oz glass of hard liquor (44 mL). General instructions  Schedule regular health, dental, and eye exams.  Stay current with your vaccines.  Tell your health care provider if: ? You often feel depressed. ? You have ever been abused or do not feel safe at home. Summary  Adopting a healthy lifestyle and getting preventive care are important in promoting health and wellness.  Follow your health care provider's instructions about healthy  diet, exercising, and getting tested or screened for diseases.  Follow your health care provider's instructions on monitoring your cholesterol and blood pressure. This information is not intended to replace advice given to you by your health care provider. Make sure you discuss any questions you have with your health care provider. Document Released: 10/20/2010 Document Revised: 03/30/2018 Document Reviewed: 03/30/2018 Elsevier Patient Education  2020 [employer]  

## 2019-03-23 NOTE — Progress Notes (Signed)
Subjective:    Patient ID: April Kane, female    DOB: [birth year], 58 y.o.   MRN: [id number]  Chief Complaint  Patient presents with  . Annual Exam   PT presents to the office today for CPE without pap.  Hypertension This is a chronic problem. The current episode started more than 1 year ago. The problem has been resolved since onset. The problem is controlled. Pertinent negatives include no headaches, malaise/fatigue, peripheral edema or shortness of breath. Risk factors for coronary artery disease include obesity and sedentary lifestyle. The current treatment provides moderate improvement. There is no history of kidney disease, CAD/MI, CVA or heart failure.  Gastroesophageal Reflux She complains of belching and heartburn. This is a chronic problem. The current episode started more than 1 year ago. The problem occurs occasionally. The symptoms are aggravated by certain foods. She has tried a PPI for the symptoms. The treatment provided moderate relief.  Hyperlipidemia This is a chronic problem. The current episode started more than 1 year ago. The problem is controlled. Recent lipid tests were reviewed and are normal. Pertinent negatives include no shortness of breath. Current antihyperlipidemic treatment includes statins and diet change. The current treatment provides mild improvement of lipids.  Fibromyalgia PT takes amitriptyline. States she is doing well with this.     Review of Systems  Constitutional: Negative for malaise/fatigue.  Respiratory: Negative for shortness of breath.   Gastrointestinal: Positive for heartburn.  Neurological: Negative for headaches.  All other systems reviewed and are negative.  Family History  Problem Relation Age of Onset  . Hypertension Mother   . Cancer Father        lung   Social History   Socioeconomic History  . Marital status: Divorced    Spouse name: Not on file  . Number of children: Not on file  . Years of education: Not on file  .  Highest education level: Not on file  Occupational History  . Not on file  Social Needs  . Financial resource strain: Not on file  . Food insecurity    Worry: Not on file    Inability: Not on file  . Transportation needs    Medical: Not on file    Non-medical: Not on file  Tobacco Use  . Smoking status: Former Smoker    Quit date: 08/05/2016    Years since quitting: 2.6  . Smokeless tobacco: Never Used  Substance and Sexual Activity  . Alcohol use: No    Alcohol/week: 0.0 standard drinks  . Drug use: No  . Sexual activity: Not on file  Lifestyle  . Physical activity    Days per week: Not on file    Minutes per session: Not on file  . Stress: Not on file  Relationships  . Social Herbalist on phone: Not on file    Gets together: Not on file    Attends religious service: Not on file    Active member of club or organization: Not on file    Attends meetings of clubs or organizations: Not on file    Relationship status: Not on file  Other Topics Concern  . Not on file  Social History Narrative  . Not on file       Objective:   Physical Exam Vitals signs reviewed.  Constitutional:      General: She is not in acute distress.    Appearance: She is well-developed.  HENT:     Head: Normocephalic and  atraumatic.     Right Ear: Tympanic membrane normal.     Left Ear: Tympanic membrane normal.  Eyes:     Pupils: Pupils are equal, round, and reactive to light.  Neck:     Musculoskeletal: Normal range of motion and neck supple.     Thyroid: No thyromegaly.  Cardiovascular:     Rate and Rhythm: Normal rate and regular rhythm.     Heart sounds: Normal heart sounds. No murmur.  Pulmonary:     Effort: Pulmonary effort is normal. No respiratory distress.     Breath sounds: Normal breath sounds. No wheezing.  Abdominal:     General: Bowel sounds are normal. There is no distension.     Palpations: Abdomen is soft.     Tenderness: There is no abdominal tenderness.   Musculoskeletal: Normal range of motion.        General: No tenderness.  Skin:    General: Skin is warm and dry.  Neurological:     Mental Status: She is alert and oriented to person, place, and time.     Cranial Nerves: No cranial nerve deficit.     Deep Tendon Reflexes: Reflexes are normal and symmetric.  Psychiatric:        Behavior: Behavior normal.        Thought Content: Thought content normal.        Judgment: Judgment normal.       BP 121/82   Pulse 77   Temp 98.4 F (36.9 C) (Temporal)   Ht '5\' 8"'  (1.727 m)   Wt 175 lb (79.4 kg)   LMP 04/20/2013   SpO2 97%   BMI 26.61 kg/m      Assessment & Plan:  April Kane comes in today with chief complaint of Annual Exam   Diagnosis and orders addressed:  1. Annual physical exam - CMP14+EGFR - CBC with Differential/Platelet - Lipid panel - TSH  2. Essential hypertension - CMP14+EGFR - CBC with Differential/Platelet - hydrochlorothiazide (HYDRODIURIL) 25 MG tablet; Take 1 tablet (25 mg total) by mouth daily.  Dispense: 90 tablet; Refill: 4 - atenolol (TENORMIN) 50 MG tablet; Take 1 tablet (50 mg total) by mouth daily.  Dispense: 90 tablet; Refill: 4  3. Gastroesophageal reflux disease, unspecified whether esophagitis present - CMP14+EGFR - CBC with Differential/Platelet  4. Fibromyalgia - CMP14+EGFR - CBC with Differential/Platelet - amitriptyline (ELAVIL) 100 MG tablet; Take 1 tablet (100 mg total) by mouth at bedtime. (Needs to be seen)  Dispense: 90 tablet; Refill: 4  5. Hyperlipidemia, unspecified hyperlipidemia type - CMP14+EGFR - CBC with Differential/Platelet - Lipid panel  6. Vitamin D deficiency - CMP14+EGFR - CBC with Differential/Platelet - Vitamin D 25 hydroxy  7. Overweight (BMI 25.0-29.9)  - CMP14+EGFR - CBC with Differential/Platelet  8. Colon cancer screening - Cologuard - CMP14+EGFR - CBC with Differential/Platelet  9. Screening for malignant neoplasm of the rectum - Cologuard -  CMP14+EGFR - CBC with Differential/Platelet  10. Encounter for screening for malignant neoplasm of breast, unspecified screening modality - [facility] - CMP14+EGFR - CBC with Differential/Platelet  11. Gastroesophageal reflux disease - omeprazole (PRILOSEC) 20 MG capsule; Take 1 capsule (20 mg total) by mouth daily.  Dispense: 90 capsule; Refill: 4   Labs pending Health Maintenance reviewed Diet and exercise encouraged  Follow up plan: 1 year   Evelina Dun, FNP

## 2019-03-24 ENCOUNTER — Other Ambulatory Visit: Payer: Self-pay | Admitting: Family

## 2019-03-24 LAB — CMP14+EGFR
ALT: 63 IU/L — ABNORMAL HIGH (ref 0–32)
AST: 44 IU/L — ABNORMAL HIGH (ref 0–40)
Albumin/Globulin Ratio: 2.1 (ref 1.2–2.2)
Albumin: 4.4 g/dL (ref 3.8–4.9)
Alkaline Phosphatase: 106 IU/L (ref 39–117)
BUN/Creatinine Ratio: 10 (ref 9–23)
BUN: 8 mg/dL (ref 6–24)
Bilirubin Total: 0.4 mg/dL (ref 0.0–1.2)
CO2: 28 mmol/L (ref 20–29)
Calcium: 9.4 mg/dL (ref 8.7–10.2)
Chloride: 98 mmol/L (ref 96–106)
Creatinine, Ser: 0.84 mg/dL (ref 0.57–1.00)
GFR calc Af Amer: 90 mL/min/{1.73_m2} (ref >59)
GFR calc non Af Amer: 78 mL/min/{1.73_m2} (ref >59)
Globulin, Total: 2.1 g/dL (ref 1.5–4.5)
Glucose: 130 mg/dL — ABNORMAL HIGH (ref 65–99)
Potassium: 3.4 mmol/L — ABNORMAL LOW (ref 3.5–5.2)
Sodium: 141 mmol/L (ref 134–144)
Total Protein: 6.5 g/dL (ref 6.0–8.5)

## 2019-03-24 LAB — CBC WITH DIFFERENTIAL/PLATELET
Basophils Absolute: 0 10*3/uL (ref 0.0–0.2)
Basos: 0 %
EOS (ABSOLUTE): 0.3 10*3/uL (ref 0.0–0.4)
Eos: 5 %
Hematocrit: 43.3 % (ref 34.0–46.6)
Hemoglobin: 14.3 g/dL (ref 11.1–15.9)
Immature Grans (Abs): 0 10*3/uL (ref 0.0–0.1)
Immature Granulocytes: 0 %
Lymphocytes Absolute: 1.7 10*3/uL (ref 0.7–3.1)
Lymphs: 33 %
MCH: 28.5 pg (ref 26.6–33.0)
MCHC: 33 g/dL (ref 31.5–35.7)
MCV: 86 fL (ref 79–97)
Monocytes Absolute: 0.3 10*3/uL (ref 0.1–0.9)
Monocytes: 6 %
Neutrophils Absolute: 3 10*3/uL (ref 1.4–7.0)
Neutrophils: 56 %
Platelets: 286 10*3/uL (ref 150–450)
RBC: 5.02 x10E6/uL (ref 3.77–5.28)
RDW: 13.2 % (ref 11.7–15.4)
WBC: 5.3 10*3/uL (ref 3.4–10.8)

## 2019-03-24 LAB — LIPID PANEL
Chol/HDL Ratio: 5.7 ratio — ABNORMAL HIGH (ref 0.0–4.4)
Cholesterol, Total: 210 mg/dL — ABNORMAL HIGH (ref 100–199)
HDL: 37 mg/dL — ABNORMAL LOW (ref >39)
LDL Chol Calc (NIH): 123 mg/dL — ABNORMAL HIGH (ref 0–99)
Triglycerides: 280 mg/dL — ABNORMAL HIGH (ref 0–149)
VLDL Cholesterol Cal: 50 mg/dL — ABNORMAL HIGH (ref 5–40)

## 2019-03-24 LAB — VITAMIN D 25 HYDROXY (VIT D DEFICIENCY, FRACTURES): Vit D, 25-Hydroxy: 10.1 ng/mL — ABNORMAL LOW (ref 30.0–100.0)

## 2019-03-24 LAB — TSH: TSH: 1.8 u[IU]/mL (ref 0.450–4.500)

## 2019-03-24 MED ORDER — VITAMIN D (ERGOCALCIFEROL) 1.25 MG (50000 UNIT) PO CAPS: 50000.0000 [IU] | ORAL_CAPSULE | ORAL | 3 refills | Status: DC

## 2019-03-28 ENCOUNTER — Other Ambulatory Visit: Payer: Self-pay | Admitting: Family

## 2019-03-28 DIAGNOSIS — R7303 Prediabetes: Secondary | ICD-10-CM | POA: Insufficient documentation

## 2019-03-28 LAB — SPECIMEN STATUS REPORT

## 2019-03-28 LAB — HGB A1C W/O EAG: Hgb A1c MFr Bld: 5.7 % — ABNORMAL HIGH (ref 4.8–5.6)

## 2019-06-16 ENCOUNTER — Telehealth: Payer: Self-pay | Admitting: Family

## 2019-06-16 DIAGNOSIS — Z1212 Encounter for screening for malignant neoplasm of rectum: Secondary | ICD-10-CM

## 2019-06-16 NOTE — Telephone Encounter (Signed)
Pt called requesting to be referred to have a mammogram. Pt says she cant have one done on the bus that comes here because its not covered under her insurance. Needs to be scheduled on a Thursday because its her only day off.

## 2019-06-16 NOTE — Telephone Encounter (Signed)
Lmtcb and see where patient wanted to be referred to for mammo appt?

## 2019-06-16 NOTE — Telephone Encounter (Signed)
Mammogram ordered

## 2019-06-16 NOTE — Telephone Encounter (Signed)
Patient wants [facility] referral sent

## 2019-06-23 ENCOUNTER — Other Ambulatory Visit: Payer: Self-pay

## 2019-06-23 DIAGNOSIS — Z1231 Encounter for screening mammogram for malignant neoplasm of breast: Secondary | ICD-10-CM

## 2019-07-06 ENCOUNTER — Encounter (HOSPITAL_COMMUNITY): Payer: Self-pay

## 2019-07-06 ENCOUNTER — Other Ambulatory Visit: Payer: Self-pay

## 2019-07-06 ENCOUNTER — Ambulatory Visit (HOSPITAL_COMMUNITY)
Admission: RE | Admit: 2019-07-06 | Discharge: 2019-07-06 | Disposition: A | Discharge status: Home / Self Care | Payer: 59 | Source: Ambulatory Visit | Attending: Family | Admitting: Family

## 2019-07-06 DIAGNOSIS — Z1231 Encounter for screening mammogram for malignant neoplasm of breast: Secondary | ICD-10-CM | POA: Diagnosis not present

## 2020-03-21 ENCOUNTER — Encounter: Payer: Self-pay | Admitting: *Deleted

## 2020-03-24 ENCOUNTER — Other Ambulatory Visit: Payer: Self-pay | Admitting: Family

## 2020-03-28 ENCOUNTER — Ambulatory Visit: Payer: 59 | Admitting: Family

## 2020-03-28 ENCOUNTER — Telehealth: Payer: Self-pay

## 2020-03-28 MED ORDER — VITAMIN D (ERGOCALCIFEROL) 1.25 MG (50000 UNIT) PO CAPS: 50000.0000 [IU] | ORAL_CAPSULE | ORAL | 0 refills | Status: DC

## 2020-03-28 NOTE — Telephone Encounter (Signed)
Refill sent.

## 2020-03-28 NOTE — Telephone Encounter (Signed)
  Prescription Request  03/28/2020  What is the name of the medication or equipment? Vit d refill. Pt had appt on 12/09 but got moved to January because provider went on vacation. Pt Is out  Have you contacted your pharmacy to request a refill? (if applicable) yes  Which pharmacy would you like this sent to? [facility]   Patient notified that their request is being sent to the clinical staff for review and that they should receive a response within 2 business days.

## 2020-03-31 ENCOUNTER — Other Ambulatory Visit: Payer: Self-pay | Admitting: Family

## 2020-03-31 DIAGNOSIS — I10 Essential (primary) hypertension: Secondary | ICD-10-CM

## 2020-04-01 ENCOUNTER — Telehealth: Payer: Self-pay

## 2020-04-01 DIAGNOSIS — K219 Gastro-esophageal reflux disease without esophagitis: Secondary | ICD-10-CM

## 2020-04-01 DIAGNOSIS — M797 Fibromyalgia: Secondary | ICD-10-CM

## 2020-04-01 DIAGNOSIS — I10 Essential (primary) hypertension: Secondary | ICD-10-CM

## 2020-04-01 MED ORDER — HYDROCHLOROTHIAZIDE 25 MG PO TABS: 25.0000 mg | ORAL_TABLET | Freq: Every day | ORAL | 1 refills | Status: DC

## 2020-04-01 MED ORDER — AMITRIPTYLINE HCL 100 MG PO TABS: 100.0000 mg | ORAL_TABLET | Freq: Every day | ORAL | 1 refills | Status: DC

## 2020-04-01 MED ORDER — OMEPRAZOLE 20 MG PO CPDR: 20.0000 mg | DELAYED_RELEASE_CAPSULE | Freq: Every day | ORAL | 1 refills | Status: DC

## 2020-04-01 MED ORDER — ATENOLOL 50 MG PO TABS: 50.0000 mg | ORAL_TABLET | Freq: Every day | ORAL | 1 refills | Status: DC

## 2020-04-01 NOTE — Telephone Encounter (Signed)
Sent to pharm ....

## 2020-04-01 NOTE — Telephone Encounter (Signed)
  Prescription Request  04/01/2020  What is the name of the medication or equipment?  atenolol (TENORMIN) 50 MG tablet hydrochlorothiazide (HYDRODIURIL) 25 MG tablet omeprazole (PRILOSEC) 20 MG capsule amitriptyline (ELAVIL) 100 MG tablet  Have you contacted your pharmacy to request a refill? (if applicable) no needs new rx. She had an apt with Christy last week but rescheduled because [facility] Colonel is out.  Which pharmacy would you like this sent to? [facility]    Patient notified that their request is being sent to the clinical staff for review and that they should receive a response within 2 business days.

## 2020-04-04 ENCOUNTER — Telehealth: Payer: Self-pay

## 2020-04-04 DIAGNOSIS — J0110 Acute frontal sinusitis, unspecified: Secondary | ICD-10-CM

## 2020-04-04 DIAGNOSIS — I10 Essential (primary) hypertension: Secondary | ICD-10-CM

## 2020-04-04 DIAGNOSIS — M797 Fibromyalgia: Secondary | ICD-10-CM

## 2020-04-04 DIAGNOSIS — K219 Gastro-esophageal reflux disease without esophagitis: Secondary | ICD-10-CM

## 2020-04-04 MED ORDER — AMITRIPTYLINE HCL 100 MG PO TABS: 100.0000 mg | ORAL_TABLET | Freq: Every day | ORAL | 1 refills | Status: DC

## 2020-04-04 MED ORDER — ATENOLOL 50 MG PO TABS: 50.0000 mg | ORAL_TABLET | Freq: Every day | ORAL | 1 refills | Status: DC

## 2020-04-04 MED ORDER — OMEPRAZOLE 20 MG PO CPDR: 20.0000 mg | DELAYED_RELEASE_CAPSULE | Freq: Every day | ORAL | 1 refills | Status: DC

## 2020-04-04 MED ORDER — HYDROCHLOROTHIAZIDE 25 MG PO TABS: 25.0000 mg | ORAL_TABLET | Freq: Every day | ORAL | 1 refills | Status: DC

## 2020-04-04 MED ORDER — FLUTICASONE PROPIONATE 50 MCG/ACT NA SUSP: 2.0000 | Freq: Every day | NASAL | 6 refills | Status: DC

## 2020-04-04 NOTE — Telephone Encounter (Signed)
Patient aware and verbalizes understanding and states she will call back to schedule appointment.

## 2020-04-04 NOTE — Telephone Encounter (Signed)
Prescriptions sent to pharmacy, make sure you schedule appointment.

## 2020-04-26 ENCOUNTER — Ambulatory Visit: Payer: 59 | Admitting: Family

## 2020-05-02 ENCOUNTER — Encounter: Payer: 59 | Admitting: Family

## 2020-07-11 ENCOUNTER — Telehealth: Payer: Self-pay

## 2020-07-11 NOTE — Telephone Encounter (Signed)
  Prescription Request  07/11/2020  What is the name of the medication or equipment? Vitamin D  Have you contacted your pharmacy to request a refill? (if applicable) Yes  Which pharmacy would you like this sent to? [facility], [place]  Pt does not have insurance

## 2020-07-11 NOTE — Telephone Encounter (Signed)
Recommended OTC 2000u daily until she has her appointment and is able to have labwork done to see what her level is and if she needs the prescription dose

## 2020-08-16 ENCOUNTER — Encounter: Payer: 59 | Admitting: Family

## 2020-08-23 ENCOUNTER — Encounter: Payer: Self-pay | Admitting: Family

## 2020-08-30 ENCOUNTER — Encounter: Payer: Self-pay | Admitting: Family

## 2020-08-30 ENCOUNTER — Other Ambulatory Visit: Payer: Self-pay

## 2020-08-30 ENCOUNTER — Other Ambulatory Visit (HOSPITAL_COMMUNITY)
Admission: RE | Admit: 2020-08-30 | Discharge: 2020-08-30 | Disposition: A | Discharge status: Home / Self Care | Payer: BC Managed Care – PPO | Source: Ambulatory Visit | Attending: Family | Admitting: Family

## 2020-08-30 ENCOUNTER — Ambulatory Visit (INDEPENDENT_AMBULATORY_CARE_PROVIDER_SITE_OTHER): Payer: BC Managed Care – PPO | Admitting: Family

## 2020-08-30 VITALS — BP 119/82 | HR 82 | Temp 97.5°F | Resp 20 | Ht 68.0 in | Wt 160.4 lb

## 2020-08-30 DIAGNOSIS — E663 Overweight: Secondary | ICD-10-CM

## 2020-08-30 DIAGNOSIS — Z0001 Encounter for general adult medical examination with abnormal findings: Secondary | ICD-10-CM

## 2020-08-30 DIAGNOSIS — Z01411 Encounter for gynecological examination (general) (routine) with abnormal findings: Secondary | ICD-10-CM | POA: Diagnosis not present

## 2020-08-30 DIAGNOSIS — E785 Hyperlipidemia, unspecified: Secondary | ICD-10-CM

## 2020-08-30 DIAGNOSIS — E559 Vitamin D deficiency, unspecified: Secondary | ICD-10-CM | POA: Diagnosis not present

## 2020-08-30 DIAGNOSIS — Z01419 Encounter for gynecological examination (general) (routine) without abnormal findings: Secondary | ICD-10-CM | POA: Insufficient documentation

## 2020-08-30 DIAGNOSIS — I10 Essential (primary) hypertension: Secondary | ICD-10-CM

## 2020-08-30 DIAGNOSIS — M546 Pain in thoracic spine: Secondary | ICD-10-CM | POA: Diagnosis not present

## 2020-08-30 DIAGNOSIS — K219 Gastro-esophageal reflux disease without esophagitis: Secondary | ICD-10-CM | POA: Diagnosis not present

## 2020-08-30 DIAGNOSIS — Z1211 Encounter for screening for malignant neoplasm of colon: Secondary | ICD-10-CM | POA: Diagnosis not present

## 2020-08-30 DIAGNOSIS — R7303 Prediabetes: Secondary | ICD-10-CM

## 2020-08-30 DIAGNOSIS — Z0000 Encounter for general adult medical examination without abnormal findings: Secondary | ICD-10-CM | POA: Diagnosis not present

## 2020-08-30 MED ORDER — PREDNISONE 10 MG (21) PO TBPK: ORAL_TABLET | ORAL | 0 refills | Status: DC

## 2020-08-30 MED ORDER — DICLOFENAC SODIUM 75 MG PO TBEC: 75.0000 mg | DELAYED_RELEASE_TABLET | Freq: Two times a day (BID) | ORAL | 2 refills | Status: DC

## 2020-08-30 NOTE — Patient Instructions (Signed)
Health Maintenance, Female Adopting a healthy lifestyle and getting preventive care are important in promoting health and wellness. Ask your health care provider about:  The right schedule for you to have regular tests and exams.  Things you can do on your own to prevent diseases and keep yourself healthy. What should I know about diet, weight, and exercise? Eat a healthy diet  Eat a diet that includes plenty of vegetables, fruits, low-fat dairy products, and lean protein.  Do not eat a lot of foods that are high in solid fats, added sugars, or sodium.   Maintain a healthy weight Body mass index (BMI) is used to identify weight problems. It estimates body fat based on height and weight. Your health care provider can help determine your BMI and help you achieve or maintain a healthy weight. Get regular exercise Get regular exercise. This is one of the most important things you can do for your health. Most adults should:  Exercise for at least 150 minutes each week. The exercise should increase your heart rate and make you sweat (moderate-intensity exercise).  Do strengthening exercises at least twice a week. This is in addition to the moderate-intensity exercise.  Spend less time sitting. Even light physical activity can be beneficial. Watch cholesterol and blood lipids Have your blood tested for lipids and cholesterol at 58 years of age, then have this test every 5 years. Have your cholesterol levels checked more often if:  Your lipid or cholesterol levels are high.  You are older than 58 years of age.  You are at high risk for heart disease. What should I know about cancer screening? Depending on your health history and family history, you may need to have cancer screening at various ages. This may include screening for:  Breast cancer.  Cervical cancer.  Colorectal cancer.  Skin cancer.  Lung cancer. What should I know about heart disease, diabetes, and high blood  pressure? Blood pressure and heart disease  High blood pressure causes heart disease and increases the risk of stroke. This is more likely to develop in people who have high blood pressure readings, are of African descent, or are overweight.  Have your blood pressure checked: ? Every 3-5 years if you are 18-39 years of age. ? Every year if you are 40 years old or older. Diabetes Have regular diabetes screenings. This checks your fasting blood sugar level. Have the screening done:  Once every three years after age 40 if you are at a normal weight and have a low risk for diabetes.  More often and at a younger age if you are overweight or have a high risk for diabetes. What should I know about preventing infection? Hepatitis B If you have a higher risk for hepatitis B, you should be screened for this virus. Talk with your health care provider to find out if you are at risk for hepatitis B infection. Hepatitis C Testing is recommended for:  Everyone born from 1945 through 1965.  Anyone with known risk factors for hepatitis C. Sexually transmitted infections (STIs)  Get screened for STIs, including gonorrhea and chlamydia, if: ? You are sexually active and are younger than 58 years of age. ? You are older than 58 years of age and your health care provider tells you that you are at risk for this type of infection. ? Your sexual activity has changed since you were last screened, and you are at increased risk for chlamydia or gonorrhea. Ask your health care provider   if you are at risk.  Ask your health care provider about whether you are at high risk for HIV. Your health care provider may recommend a prescription medicine to help prevent HIV infection. If you choose to take medicine to prevent HIV, you should first get tested for HIV. You should then be tested every 3 months for as long as you are taking the medicine. Pregnancy  If you are about to stop having your period (premenopausal) and  you may become pregnant, seek counseling before you get pregnant.  Take 400 to 800 micrograms (mcg) of folic acid every day if you become pregnant.  Ask for birth control (contraception) if you want to prevent pregnancy. Osteoporosis and menopause Osteoporosis is a disease in which the bones lose minerals and strength with aging. This can result in bone fractures. If you are 65 years old or older, or if you are at risk for osteoporosis and fractures, ask your health care provider if you should:  Be screened for bone loss.  Take a calcium or vitamin D supplement to lower your risk of fractures.  Be given hormone replacement therapy (HRT) to treat symptoms of menopause. Follow these instructions at home: Lifestyle  Do not use any products that contain nicotine or tobacco, such as cigarettes, e-cigarettes, and chewing tobacco. If you need help quitting, ask your health care provider.  Do not use street drugs.  Do not share needles.  Ask your health care provider for help if you need support or information about quitting drugs. Alcohol use  Do not drink alcohol if: ? Your health care provider tells you not to drink. ? You are pregnant, may be pregnant, or are planning to become pregnant.  If you drink alcohol: ? Limit how much you use to 0-1 drink a day. ? Limit intake if you are breastfeeding.  Be aware of how much alcohol is in your drink. In the [place] one drink equals one 12 oz bottle of beer (355 mL), one 5 oz glass of wine (148 mL), or one 1 oz glass of hard liquor (44 mL). General instructions  Schedule regular health, dental, and eye exams.  Stay current with your vaccines.  Tell your health care provider if: ? You often feel depressed. ? You have ever been abused or do not feel safe at home. Summary  Adopting a healthy lifestyle and getting preventive care are important in promoting health and wellness.  Follow your health care provider's instructions about healthy  diet, exercising, and getting tested or screened for diseases.  Follow your health care provider's instructions on monitoring your cholesterol and blood pressure. This information is not intended to replace advice given to you by your health care provider. Make sure you discuss any questions you have with your health care provider. Document Revised: 03/30/2018 Document Reviewed: 03/30/2018 Elsevier Patient Education  2021 Elsevier Inc.  

## 2020-08-30 NOTE — Progress Notes (Signed)
Subjective:    Patient ID: April Kane, female    DOB: Nov 10, 1960, 60 y.o.   MRN: [id number]  Chief Complaint  Patient presents with  . Gynecologic Exam   Pt present to the office today for CPE with pap.  Gynecologic Exam The patient's pertinent negatives include no genital itching or vaginal discharge. This is a chronic problem. The current episode started more than 1 year ago. The problem occurs intermittently. The problem has been waxing and waning. Associated symptoms include back pain.  Hypertension This is a chronic problem. The current episode started more than 1 year ago. The problem has been resolved since onset. The problem is controlled. Pertinent negatives include no blurred vision, malaise/fatigue, peripheral edema or shortness of breath. Risk factors for coronary artery disease include dyslipidemia, obesity and sedentary lifestyle. The current treatment provides moderate improvement.  Gastroesophageal Reflux She complains of belching and heartburn. This is a chronic problem. The current episode started more than 1 year ago. The problem occurs occasionally. The problem has been waxing and waning. She has tried a PPI for the symptoms. The treatment provided moderate relief.  Diabetes She presents for her follow-up diabetic visit. She has type 2 diabetes mellitus. There are no hypoglycemic associated symptoms. Pertinent negatives for diabetes include no blurred vision and no foot paresthesias. There are no hypoglycemic complications. Symptoms are stable. Risk factors for coronary artery disease include dyslipidemia, diabetes mellitus, hypertension, sedentary lifestyle and post-menopausal. (Does not check at home) Eye exam is current.  Hyperlipidemia This is a chronic problem. The current episode started more than 1 year ago. Pertinent negatives include no shortness of breath. Current antihyperlipidemic treatment includes diet change. The current treatment provides moderate improvement  of lipids. Risk factors for coronary artery disease include dyslipidemia, hypertension and a sedentary lifestyle.  Back Pain This is a new problem. The current episode started more than 1 month ago. The problem occurs intermittently. The pain is present in the thoracic spine. The quality of the pain is described as aching. The pain is at a severity of 6/10.      Review of Systems  Constitutional: Negative for malaise/fatigue.  Eyes: Negative for blurred vision.  Respiratory: Negative for shortness of breath.   Gastrointestinal: Positive for heartburn.  Genitourinary: Negative for vaginal discharge.  Musculoskeletal: Positive for back pain.  All other systems reviewed and are negative.  Family History  Problem Relation Age of Onset  . Hypertension Mother   . Cancer Father        lung  . Breast cancer Maternal Grandmother    Social History   Socioeconomic History  . Marital status: Divorced    Spouse name: Not on file  . Number of children: Not on file  . Years of education: Not on file  . Highest education level: Not on file  Occupational History  . Not on file  Tobacco Use  . Smoking status: Former Smoker    Quit date: 08/05/2016    Years since quitting: 4.0  . Smokeless tobacco: Never Used  Vaping Use  . Vaping Use: Never used  Substance and Sexual Activity  . Alcohol use: No    Alcohol/week: 0.0 standard drinks  . Drug use: No  . Sexual activity: Not on file  Other Topics Concern  . Not on file  Social History Narrative  . Not on file   Social Determinants of Health   Financial Resource Strain: Not on file  Food Insecurity: Not on file  Transportation Needs:  Not on file  Physical Activity: Not on file  Stress: Not on file  Social Connections: Not on file        Objective:   Physical Exam Vitals reviewed.  Constitutional:      General: She is not in acute distress.    Appearance: She is well-developed.  HENT:     Head: Normocephalic and atraumatic.      Right Ear: Tympanic membrane normal.     Left Ear: Tympanic membrane normal.  Eyes:     Pupils: Pupils are equal, round, and reactive to light.  Neck:     Thyroid: No thyromegaly.  Cardiovascular:     Rate and Rhythm: Normal rate and regular rhythm.     Heart sounds: Normal heart sounds. No murmur heard.   Pulmonary:     Effort: Pulmonary effort is normal. No respiratory distress.     Breath sounds: Normal breath sounds. No wheezing.  Chest:  Breasts:     Right: No swelling, bleeding, inverted nipple, mass, nipple discharge, skin change or tenderness.     Left: No swelling, bleeding, inverted nipple, mass, nipple discharge, skin change or tenderness.    Abdominal:     General: Bowel sounds are normal. There is no distension.     Palpations: Abdomen is soft.     Tenderness: There is no abdominal tenderness.  Genitourinary:    Comments: Bimanual exam- no adnexal masses or tenderness, ovaries nonpalpable   Cervix parous and pink- No discharge, tilted   Musculoskeletal:        General: No tenderness. Normal range of motion.     Cervical back: Normal range of motion and neck supple.  Skin:    General: Skin is warm and dry.  Neurological:     Mental Status: She is alert and oriented to person, place, and time.     Cranial Nerves: No cranial nerve deficit.     Deep Tendon Reflexes: Reflexes are normal and symmetric.  Psychiatric:        Behavior: Behavior normal.        Thought Content: Thought content normal.        Judgment: Judgment normal.       BP 119/82   Pulse 82   Temp (!) 97.5 F (36.4 C) (Temporal)   Resp 20   Ht '5\' 8"'  (1.727 m)   Wt 160 lb 6 oz (72.7 kg)   LMP 04/20/2013   SpO2 98%   BMI 24.38 kg/m      Assessment & Plan:  April Kane comes in today with chief complaint of Gynecologic Exam   Diagnosis and orders addressed:  1. Essential hypertension - CMP14+EGFR - CBC with Differential/Platelet  2. Gastroesophageal reflux disease,  unspecified whether esophagitis present - CMP14+EGFR - CBC with Differential/Platelet  3. Vitamin D deficiency - CMP14+EGFR - CBC with Differential/Platelet - VITAMIN D 25 Hydroxy (Vit-D Deficiency, Fractures)  4. Prediabetes - CMP14+EGFR - CBC with Differential/Platelet  5. Overweight (BMI 25.0-29.9) - CMP14+EGFR - CBC with Differential/Platelet  6. Hyperlipidemia, unspecified hyperlipidemia type - CMP14+EGFR - CBC with Differential/Platelet  7. Annual physical exam - CMP14+EGFR - CBC with Differential/Platelet - Lipid panel - TSH - Cytology - PAP([facility]) - Cologuard  8. Gynecologic exam normal - CMP14+EGFR - CBC with Differential/Platelet - Cytology - PAP([facility])  9. Colon cancer screening - CMP14+EGFR - CBC with Differential/Platelet - Cologuard  10. Acute right-sided thoracic back pain Rest No other NSAID"s  - diclofenac (VOLTAREN) 75 MG EC tablet;  Take 1 tablet (75 mg total) by mouth 2 (two) times daily.  Dispense: 60 tablet; Refill: 2 - predniSONE (STERAPRED UNI-PAK 21 TAB) 10 MG (21) TBPK tablet; Use as directed  Dispense: 21 tablet; Refill: 0   Labs pending Health Maintenance reviewed Diet and exercise encouraged  Follow up plan: 1 year    April Dun, FNP

## 2020-08-31 LAB — CBC WITH DIFFERENTIAL/PLATELET
Basophils Absolute: 0 10*3/uL (ref 0.0–0.2)
Basos: 1 %
EOS (ABSOLUTE): 0.3 10*3/uL (ref 0.0–0.4)
Eos: 5 %
Hematocrit: 43.3 % (ref 34.0–46.6)
Hemoglobin: 14.3 g/dL (ref 11.1–15.9)
Immature Grans (Abs): 0 10*3/uL (ref 0.0–0.1)
Immature Granulocytes: 0 %
Lymphocytes Absolute: 2.1 10*3/uL (ref 0.7–3.1)
Lymphs: 34 %
MCH: 28.4 pg (ref 26.6–33.0)
MCHC: 33 g/dL (ref 31.5–35.7)
MCV: 86 fL (ref 79–97)
Monocytes Absolute: 0.4 10*3/uL (ref 0.1–0.9)
Monocytes: 7 %
Neutrophils Absolute: 3.4 10*3/uL (ref 1.4–7.0)
Neutrophils: 53 %
Platelets: 262 10*3/uL (ref 150–450)
RBC: 5.04 x10E6/uL (ref 3.77–5.28)
RDW: 13 % (ref 11.7–15.4)
WBC: 6.3 10*3/uL (ref 3.4–10.8)

## 2020-08-31 LAB — CMP14+EGFR
ALT: 44 IU/L — ABNORMAL HIGH (ref 0–32)
AST: 33 IU/L (ref 0–40)
Albumin/Globulin Ratio: 1.8 (ref 1.2–2.2)
Albumin: 4.3 g/dL (ref 3.8–4.9)
Alkaline Phosphatase: 113 IU/L (ref 44–121)
BUN/Creatinine Ratio: 13 (ref 9–23)
BUN: 12 mg/dL (ref 6–24)
Bilirubin Total: 0.3 mg/dL (ref 0.0–1.2)
CO2: 28 mmol/L (ref 20–29)
Calcium: 9.3 mg/dL (ref 8.7–10.2)
Chloride: 96 mmol/L (ref 96–106)
Creatinine, Ser: 0.94 mg/dL (ref 0.57–1.00)
Globulin, Total: 2.4 g/dL (ref 1.5–4.5)
Glucose: 106 mg/dL — ABNORMAL HIGH (ref 65–99)
Potassium: 3.2 mmol/L — ABNORMAL LOW (ref 3.5–5.2)
Sodium: 142 mmol/L (ref 134–144)
Total Protein: 6.7 g/dL (ref 6.0–8.5)
eGFR: 71 mL/min/{1.73_m2} (ref >59)

## 2020-08-31 LAB — VITAMIN D 25 HYDROXY (VIT D DEFICIENCY, FRACTURES): Vit D, 25-Hydroxy: 29.9 ng/mL — ABNORMAL LOW (ref 30.0–100.0)

## 2020-08-31 LAB — LIPID PANEL
Chol/HDL Ratio: 4.7 ratio — ABNORMAL HIGH (ref 0.0–4.4)
Cholesterol, Total: 191 mg/dL (ref 100–199)
HDL: 41 mg/dL (ref >39)
LDL Chol Calc (NIH): 110 mg/dL — ABNORMAL HIGH (ref 0–99)
Triglycerides: 229 mg/dL — ABNORMAL HIGH (ref 0–149)
VLDL Cholesterol Cal: 40 mg/dL (ref 5–40)

## 2020-08-31 LAB — TSH: TSH: 4.5 u[IU]/mL (ref 0.450–4.500)

## 2020-09-03 ENCOUNTER — Other Ambulatory Visit: Payer: Self-pay | Admitting: Family

## 2020-09-03 MED ORDER — VITAMIN D (ERGOCALCIFEROL) 1.25 MG (50000 UNIT) PO CAPS: 50000.0000 [IU] | ORAL_CAPSULE | ORAL | 0 refills | Status: DC

## 2020-09-03 MED ORDER — POTASSIUM CHLORIDE CRYS ER 20 MEQ PO TBCR: 20.0000 meq | EXTENDED_RELEASE_TABLET | Freq: Every day | ORAL | 1 refills | Status: DC

## 2020-09-05 ENCOUNTER — Telehealth: Payer: Self-pay | Admitting: Family

## 2020-09-05 LAB — CYTOLOGY - PAP
Comment: NEGATIVE
Diagnosis: NEGATIVE
High risk HPV: NEGATIVE

## 2020-09-05 NOTE — Telephone Encounter (Signed)
Patient aware and verbalized understanding. °

## 2020-09-27 ENCOUNTER — Telehealth: Payer: Self-pay | Admitting: Family

## 2020-09-27 NOTE — Telephone Encounter (Signed)
Reordered and left patient detailed message

## 2020-10-14 DIAGNOSIS — Z1211 Encounter for screening for malignant neoplasm of colon: Secondary | ICD-10-CM | POA: Diagnosis not present

## 2020-10-15 ENCOUNTER — Telehealth: Payer: Self-pay | Admitting: Family

## 2020-10-15 DIAGNOSIS — I10 Essential (primary) hypertension: Secondary | ICD-10-CM

## 2020-10-15 DIAGNOSIS — M797 Fibromyalgia: Secondary | ICD-10-CM

## 2020-10-15 DIAGNOSIS — J0110 Acute frontal sinusitis, unspecified: Secondary | ICD-10-CM

## 2020-10-15 DIAGNOSIS — K219 Gastro-esophageal reflux disease without esophagitis: Secondary | ICD-10-CM

## 2020-10-15 MED ORDER — OMEPRAZOLE 20 MG PO CPDR: 20.0000 mg | DELAYED_RELEASE_CAPSULE | Freq: Every day | ORAL | 5 refills | Status: DC

## 2020-10-15 MED ORDER — HYDROCHLOROTHIAZIDE 25 MG PO TABS: 25.0000 mg | ORAL_TABLET | Freq: Every day | ORAL | 4 refills | Status: DC

## 2020-10-15 MED ORDER — AMITRIPTYLINE HCL 100 MG PO TABS: 100.0000 mg | ORAL_TABLET | Freq: Every day | ORAL | 1 refills | Status: DC

## 2020-10-15 MED ORDER — FLUTICASONE PROPIONATE 50 MCG/ACT NA SUSP: 2.0000 | Freq: Every day | NASAL | 11 refills | Status: DC

## 2020-10-15 MED ORDER — ATENOLOL 50 MG PO TABS: 50.0000 mg | ORAL_TABLET | Freq: Every day | ORAL | 4 refills | Status: DC

## 2020-10-15 NOTE — Telephone Encounter (Signed)
Patient needs a refill of Amitriptyline Last office visit 08/30/20 Last refill 04/19/20, #90, 1 refill

## 2020-10-15 NOTE — Telephone Encounter (Signed)
Prescription sent to pharmacy.

## 2020-10-15 NOTE — Telephone Encounter (Signed)
Pt calling because she is having issues with her meds and would like to talk to [facility] nurse

## 2020-10-16 ENCOUNTER — Other Ambulatory Visit: Payer: Self-pay | Admitting: Family

## 2020-10-16 DIAGNOSIS — Z1231 Encounter for screening mammogram for malignant neoplasm of breast: Secondary | ICD-10-CM

## 2020-10-19 LAB — COLOGUARD: COLOGUARD: NEGATIVE

## 2020-10-24 LAB — COLOGUARD: Cologuard: NEGATIVE

## 2020-11-28 ENCOUNTER — Other Ambulatory Visit: Payer: Self-pay | Admitting: Family

## 2021-02-19 ENCOUNTER — Other Ambulatory Visit: Payer: Self-pay | Admitting: Family

## 2021-02-24 ENCOUNTER — Ambulatory Visit
Admission: RE | Admit: 2021-02-24 | Discharge: 2021-02-24 | Disposition: A | Discharge status: Home / Self Care | Payer: BC Managed Care – PPO | Source: Ambulatory Visit | Attending: Family | Admitting: Family

## 2021-02-24 ENCOUNTER — Other Ambulatory Visit: Payer: Self-pay

## 2021-02-24 DIAGNOSIS — Z1231 Encounter for screening mammogram for malignant neoplasm of breast: Secondary | ICD-10-CM

## 2021-04-21 ENCOUNTER — Other Ambulatory Visit: Payer: Self-pay | Admitting: Family

## 2021-04-21 DIAGNOSIS — K219 Gastro-esophageal reflux disease without esophagitis: Secondary | ICD-10-CM

## 2021-05-14 ENCOUNTER — Other Ambulatory Visit: Payer: Self-pay | Admitting: Family

## 2021-05-14 DIAGNOSIS — I10 Essential (primary) hypertension: Secondary | ICD-10-CM

## 2021-05-20 ENCOUNTER — Other Ambulatory Visit: Payer: Self-pay | Admitting: Family

## 2021-05-27 ENCOUNTER — Other Ambulatory Visit: Payer: Self-pay | Admitting: Family

## 2021-05-27 DIAGNOSIS — I10 Essential (primary) hypertension: Secondary | ICD-10-CM

## 2021-05-27 DIAGNOSIS — K219 Gastro-esophageal reflux disease without esophagitis: Secondary | ICD-10-CM

## 2021-05-27 NOTE — Telephone Encounter (Signed)
Apt scheduled for 06/23/2021. Pt cannot come in in Feb. She will be out of rx this weekend

## 2021-05-27 NOTE — Telephone Encounter (Signed)
30 days of atenolol and omeprazole were sent in at the end of DEC.  Pt needs an appt for further refills

## 2021-06-23 ENCOUNTER — Encounter: Payer: Self-pay | Admitting: Family

## 2021-06-23 ENCOUNTER — Ambulatory Visit: Payer: BC Managed Care – PPO | Admitting: Family

## 2021-06-23 VITALS — BP 112/75 | HR 69 | Temp 96.7°F | Ht <= 58 in | Wt 162.8 lb

## 2021-06-23 DIAGNOSIS — K219 Gastro-esophageal reflux disease without esophagitis: Secondary | ICD-10-CM

## 2021-06-23 DIAGNOSIS — E663 Overweight: Secondary | ICD-10-CM

## 2021-06-23 DIAGNOSIS — R7303 Prediabetes: Secondary | ICD-10-CM

## 2021-06-23 DIAGNOSIS — R748 Abnormal levels of other serum enzymes: Secondary | ICD-10-CM

## 2021-06-23 DIAGNOSIS — Z0001 Encounter for general adult medical examination with abnormal findings: Secondary | ICD-10-CM | POA: Diagnosis not present

## 2021-06-23 DIAGNOSIS — M1990 Unspecified osteoarthritis, unspecified site: Secondary | ICD-10-CM | POA: Insufficient documentation

## 2021-06-23 DIAGNOSIS — E559 Vitamin D deficiency, unspecified: Secondary | ICD-10-CM

## 2021-06-23 DIAGNOSIS — I10 Essential (primary) hypertension: Secondary | ICD-10-CM

## 2021-06-23 DIAGNOSIS — E785 Hyperlipidemia, unspecified: Secondary | ICD-10-CM

## 2021-06-23 DIAGNOSIS — E782 Mixed hyperlipidemia: Secondary | ICD-10-CM | POA: Diagnosis not present

## 2021-06-23 DIAGNOSIS — Z0000 Encounter for general adult medical examination without abnormal findings: Secondary | ICD-10-CM

## 2021-06-23 DIAGNOSIS — M797 Fibromyalgia: Secondary | ICD-10-CM

## 2021-06-23 DIAGNOSIS — M19011 Primary osteoarthritis, right shoulder: Secondary | ICD-10-CM | POA: Insufficient documentation

## 2021-06-23 DIAGNOSIS — R739 Hyperglycemia, unspecified: Secondary | ICD-10-CM | POA: Diagnosis not present

## 2021-06-23 MED ORDER — OMEPRAZOLE 20 MG PO CPDR: 20.0000 mg | DELAYED_RELEASE_CAPSULE | Freq: Every day | ORAL | 4 refills | Status: DC

## 2021-06-23 MED ORDER — ATENOLOL 50 MG PO TABS: 50.0000 mg | ORAL_TABLET | Freq: Every day | ORAL | 4 refills | Status: DC

## 2021-06-23 MED ORDER — HYDROCHLOROTHIAZIDE 25 MG PO TABS: 25.0000 mg | ORAL_TABLET | Freq: Every day | ORAL | 4 refills | Status: DC

## 2021-06-23 MED ORDER — AMITRIPTYLINE HCL 100 MG PO TABS: 100.0000 mg | ORAL_TABLET | Freq: Every day | ORAL | 4 refills | Status: DC

## 2021-06-23 NOTE — Progress Notes (Signed)
? ?Subjective:  ? ? Patient ID: April Kane, female    DOB: [birth year], 61 y.o.   MRN: [id number] ? ?Chief Complaint  ?Patient presents with  ? Medical Management of Chronic Issues  ? ?Pt presents to the office today for CPE.  ?Hypertension ?This is a chronic problem. The current episode started more than 1 year ago. The problem has been resolved since onset. The problem is controlled. Associated symptoms include malaise/fatigue. Pertinent negatives include no peripheral edema or shortness of breath. Risk factors for coronary artery disease include dyslipidemia and sedentary lifestyle. The current treatment provides moderate improvement.  ?Gastroesophageal Reflux ?She complains of belching and heartburn. This is a chronic problem. The current episode started more than 1 year ago. The problem occurs occasionally. The problem has been waxing and waning. She has tried a PPI for the symptoms. The treatment provided moderate relief.  ?Hyperlipidemia ?This is a chronic problem. The current episode started more than 1 year ago. The problem is uncontrolled. Pertinent negatives include no shortness of breath. Current antihyperlipidemic treatment includes diet change. The current treatment provides mild improvement of lipids. Risk factors for coronary artery disease include dyslipidemia, hypertension, a sedentary lifestyle and post-menopausal.  ?Arthritis ?Presents for follow-up visit. She complains of pain and stiffness. The symptoms have been stable. Affected locations include the right shoulder. Her pain is at a severity of 10/10.  ? ? ? ?Review of Systems  ?Constitutional:  Positive for malaise/fatigue.  ?Respiratory:  Negative for shortness of breath.   ?Gastrointestinal:  Positive for heartburn.  ?Musculoskeletal:  Positive for arthritis and stiffness.  ?All other systems reviewed and are negative. ? ?Family History  ?Problem Relation Age of Onset  ? Hypertension Mother   ? Cancer Father   ?     lung  ? Breast cancer  Maternal Grandmother   ? ?Social History  ? ?Socioeconomic History  ? Marital status: Divorced  ?  Spouse name: Not on file  ? Number of children: Not on file  ? Years of education: Not on file  ? Highest education level: Not on file  ?Occupational History  ? Not on file  ?Tobacco Use  ? Smoking status: Former  ?  Types: Cigarettes  ?  Quit date: 08/05/2016  ?  Years since quitting: 4.8  ? Smokeless tobacco: Never  ?Vaping Use  ? Vaping Use: Never used  ?Substance and Sexual Activity  ? Alcohol use: No  ?  Alcohol/week: 0.0 standard drinks  ? Drug use: No  ? Sexual activity: Not on file  ?Other Topics Concern  ? Not on file  ?Social History Narrative  ? Not on file  ? ?Social Determinants of Health  ? ?Financial Resource Strain: Not on file  ?Food Insecurity: Not on file  ?Transportation Needs: Not on file  ?Physical Activity: Not on file  ?Stress: Not on file  ?Social Connections: Not on file  ? ? ?   ?Objective:  ? Physical Exam ?Vitals reviewed.  ?Constitutional:   ?   General: She is not in acute distress. ?   Appearance: She is well-developed.  ?HENT:  ?   Head: Normocephalic and atraumatic.  ?   Right Ear: Tympanic membrane normal.  ?   Left Ear: Tympanic membrane normal.  ?Eyes:  ?   Pupils: Pupils are equal, round, and reactive to light.  ?Neck:  ?   Thyroid: No thyromegaly.  ?Cardiovascular:  ?   Rate and Rhythm: Normal rate and regular rhythm.  ?  Heart sounds: Normal heart sounds. No murmur heard. ?Pulmonary:  ?   Effort: Pulmonary effort is normal. No respiratory distress.  ?   Breath sounds: Normal breath sounds. No wheezing.  ?Abdominal:  ?   General: Bowel sounds are normal. There is no distension.  ?   Palpations: Abdomen is soft.  ?   Tenderness: There is no abdominal tenderness.  ?Musculoskeletal:     ?   General: No tenderness. Normal range of motion.  ?   Cervical back: Normal range of motion and neck supple.  ?Skin: ?   General: Skin is warm and dry.  ?Neurological:  ?   Mental Status: She is  alert and oriented to person, place, and time.  ?   Cranial Nerves: No cranial nerve deficit.  ?   Deep Tendon Reflexes: Reflexes are normal and symmetric.  ?Psychiatric:     ?   Behavior: Behavior normal.     ?   Thought Content: Thought content normal.     ?   Judgment: Judgment normal.  ? ? ? ? ? ?  BP 112/75   Pulse 69   Temp (!) 96.7 ?F (35.9 ?C) (Temporal)   Ht 2' (0.61 m)   Wt 162 lb 12.8 oz (73.8 kg)   LMP 04/20/2013   BMI 198.72 kg/m?  ? ?Assessment & Plan:  ?April Kane comes in today with chief complaint of Medical Management of Chronic Issues ? ? ?Diagnosis and orders addressed: ? ?1. Essential hypertension ?- CMP14+EGFR ?- CBC with Differential/Platelet ?- atenolol (TENORMIN) 50 MG tablet; Take 1 tablet (50 mg total) by mouth daily.  Dispense: 90 tablet; Refill: 4 ?- hydrochlorothiazide (HYDRODIURIL) 25 MG tablet; Take 1 tablet (25 mg total) by mouth daily.  Dispense: 90 tablet; Refill: 4 ? ?2. Gastroesophageal reflux disease, unspecified whether esophagitis present ? ?- CMP14+EGFR ?- CBC with Differential/Platelet ?- omeprazole (PRILOSEC) 20 MG capsule; Take 1 capsule (20 mg total) by mouth daily.  Dispense: 90 capsule; Refill: 4 ? ?3. Vitamin D deficiency ?- CMP14+EGFR ?- CBC with Differential/Platelet ? ?4. Prediabetes ?- CMP14+EGFR ?- CBC with Differential/Platelet ? ?5. Overweight (BMI 25.0-29.9) ?- CMP14+EGFR ?- CBC with Differential/Platelet ? ?6. Hyperlipidemia, unspecified hyperlipidemia type ?- CMP14+EGFR ?- CBC with Differential/Platelet ? ?7. Primary osteoarthritis of right shoulder ? ?- CMP14+EGFR ?- CBC with Differential/Platelet ? ?8. Fibromyalgia ?- amitriptyline (ELAVIL) 100 MG tablet; Take 1 tablet (100 mg total) by mouth at bedtime.  Dispense: 90 tablet; Refill: 4 ? ?9. Gastroesophageal reflux disease ? ?- omeprazole (PRILOSEC) 20 MG capsule; Take 1 capsule (20 mg total) by mouth daily.  Dispense: 90 capsule; Refill: 4 ? ?10. Annual physical exam ?- Lipid panel ?-  TSH ? ? ? ?Labs pending ?Health Maintenance reviewed ?Diet and exercise encouraged ? ?Follow up plan: ?6 months  ? ?Evelina Dun, FNP ? ? ? ?

## 2021-06-23 NOTE — Patient Instructions (Signed)
Shoulder Pain °Many things can cause shoulder pain, including: °An injury to the shoulder. °Overuse of the shoulder. °Arthritis. °The source of the pain can be: °Inflammation. °An injury to the shoulder joint. °An injury to a tendon, ligament, or bone. °Follow these instructions at home: °Pay attention to changes in your symptoms. Let your health care provider know about them. Follow these instructions to relieve your pain. °If you have a sling: °Wear the sling as told by your health care provider. Remove it only as told by your health care provider. °Loosen the sling if your fingers tingle, become numb, or turn cold and blue. °Keep the sling clean. °If the sling is not waterproof: °Do not let it get wet. Remove it to shower or bathe. °Move your arm as little as possible, but keep your hand moving to prevent swelling. °Managing pain, stiffness, and swelling ° °If directed, put ice on the painful area: °Put ice in a plastic bag. °Place a towel between your skin and the bag. °Leave the ice on for 20 minutes, 2-3 times per day. Stop applying ice if it does not help with the pain. °Squeeze a soft ball or a foam pad as much as possible. This helps to keep the shoulder from swelling. It also helps to strengthen the arm. °General instructions °Take over-the-counter and prescription medicines only as told by your health care provider. °Keep all follow-up visits as told by your health care provider. This is important. °Contact a health care provider if: °Your pain gets worse. °Your pain is not relieved with medicines. °New pain develops in your arm, hand, or fingers. °Get help right away if: °Your arm, hand, or fingers: °Tingle. °Become numb. °Become swollen. °Become painful. °Turn white or blue. °Summary °Shoulder pain can be caused by an injury, overuse, or arthritis. °Pay attention to changes in your symptoms. Let your health care provider know about them. °This condition may be treated with a sling, ice, and pain  medicines. °Contact your health care provider if the pain gets worse or new pain develops. Get help right away if your arm, hand, or fingers tingle or become numb, swollen, or painful. °Keep all follow-up visits as told by your health care provider. This is important. °This information is not intended to replace advice given to you by your health care provider. Make sure you discuss any questions you have with your health care provider. °Document Revised: 10/19/2017 Document Reviewed: 10/19/2017 °Elsevier Patient Education © 2022 [employer] Inc[employer] ° °

## 2021-06-24 ENCOUNTER — Other Ambulatory Visit: Payer: Self-pay | Admitting: Family

## 2021-06-24 LAB — CMP14+EGFR
ALT: 36 IU/L — ABNORMAL HIGH (ref 0–32)
AST: 27 IU/L (ref 0–40)
Albumin/Globulin Ratio: 2 (ref 1.2–2.2)
Albumin: 4.3 g/dL (ref 3.8–4.9)
Alkaline Phosphatase: 117 IU/L (ref 44–121)
BUN/Creatinine Ratio: 12 (ref 9–23)
BUN: 9 mg/dL (ref 6–24)
Bilirubin Total: 0.2 mg/dL (ref 0.0–1.2)
CO2: 24 mmol/L (ref 20–29)
Calcium: 9.4 mg/dL (ref 8.7–10.2)
Chloride: 100 mmol/L (ref 96–106)
Creatinine, Ser: 0.76 mg/dL (ref 0.57–1.00)
Globulin, Total: 2.2 g/dL (ref 1.5–4.5)
Glucose: 138 mg/dL — ABNORMAL HIGH (ref 70–99)
Potassium: 3.4 mmol/L — ABNORMAL LOW (ref 3.5–5.2)
Sodium: 142 mmol/L (ref 134–144)
Total Protein: 6.5 g/dL (ref 6.0–8.5)
eGFR: 91 mL/min/{1.73_m2} (ref >59)

## 2021-06-24 LAB — CBC WITH DIFFERENTIAL/PLATELET
Basophils Absolute: 0 10*3/uL (ref 0.0–0.2)
Basos: 0 %
EOS (ABSOLUTE): 0.3 10*3/uL (ref 0.0–0.4)
Eos: 5 %
Hematocrit: 43.1 % (ref 34.0–46.6)
Hemoglobin: 14.6 g/dL (ref 11.1–15.9)
Immature Grans (Abs): 0 10*3/uL (ref 0.0–0.1)
Immature Granulocytes: 0 %
Lymphocytes Absolute: 1.9 10*3/uL (ref 0.7–3.1)
Lymphs: 33 %
MCH: 28.8 pg (ref 26.6–33.0)
MCHC: 33.9 g/dL (ref 31.5–35.7)
MCV: 85 fL (ref 79–97)
Monocytes Absolute: 0.3 10*3/uL (ref 0.1–0.9)
Monocytes: 6 %
Neutrophils Absolute: 3.2 10*3/uL (ref 1.4–7.0)
Neutrophils: 56 %
Platelets: 284 10*3/uL (ref 150–450)
RBC: 5.07 x10E6/uL (ref 3.77–5.28)
RDW: 12.9 % (ref 11.7–15.4)
WBC: 5.6 10*3/uL (ref 3.4–10.8)

## 2021-06-24 MED ORDER — POTASSIUM CHLORIDE CRYS ER 20 MEQ PO TBCR: 20.0000 meq | EXTENDED_RELEASE_TABLET | Freq: Every day | ORAL | 0 refills | Status: DC

## 2021-06-24 NOTE — Addendum Note (Signed)
Addended by: Jannifer Rodney A on: 06/24/2021 03:36 PM ? ? Modules accepted: Orders ? ?

## 2021-06-25 LAB — LIPID PANEL
Chol/HDL Ratio: 5.2 ratio — ABNORMAL HIGH (ref 0.0–4.4)
Cholesterol, Total: 206 mg/dL — ABNORMAL HIGH (ref 100–199)
HDL: 40 mg/dL (ref >39)
LDL Chol Calc (NIH): 118 mg/dL — ABNORMAL HIGH (ref 0–99)
Triglycerides: 276 mg/dL — ABNORMAL HIGH (ref 0–149)
VLDL Cholesterol Cal: 48 mg/dL — ABNORMAL HIGH (ref 5–40)

## 2021-06-25 LAB — SPECIMEN STATUS REPORT

## 2021-06-25 LAB — TSH: TSH: 4.42 u[IU]/mL (ref 0.450–4.500)

## 2021-06-25 LAB — HGB A1C W/O EAG: Hgb A1c MFr Bld: 5.7 % — ABNORMAL HIGH (ref 4.8–5.6)

## 2021-06-27 ENCOUNTER — Other Ambulatory Visit: Payer: BC Managed Care – PPO

## 2021-07-02 ENCOUNTER — Ambulatory Visit (HOSPITAL_COMMUNITY)
Admission: RE | Admit: 2021-07-02 | Discharge: 2021-07-02 | Disposition: A | Discharge status: Home / Self Care | Payer: BC Managed Care – PPO | Source: Ambulatory Visit | Attending: Family | Admitting: Family

## 2021-07-02 ENCOUNTER — Other Ambulatory Visit (HOSPITAL_COMMUNITY): Payer: 59

## 2021-07-02 ENCOUNTER — Other Ambulatory Visit: Payer: Self-pay

## 2021-07-02 ENCOUNTER — Ambulatory Visit (HOSPITAL_COMMUNITY): Admission: RE | Admit: 2021-07-02 | Payer: 59 | Source: Ambulatory Visit

## 2021-07-02 ENCOUNTER — Other Ambulatory Visit: Payer: BC Managed Care – PPO

## 2021-07-02 DIAGNOSIS — Z136 Encounter for screening for cardiovascular disorders: Secondary | ICD-10-CM | POA: Diagnosis not present

## 2021-07-02 DIAGNOSIS — R748 Abnormal levels of other serum enzymes: Secondary | ICD-10-CM | POA: Insufficient documentation

## 2021-07-02 DIAGNOSIS — Z0000 Encounter for general adult medical examination without abnormal findings: Secondary | ICD-10-CM | POA: Diagnosis not present

## 2021-07-02 DIAGNOSIS — K760 Fatty (change of) liver, not elsewhere classified: Secondary | ICD-10-CM | POA: Diagnosis not present

## 2021-07-02 DIAGNOSIS — D734 Cyst of spleen: Secondary | ICD-10-CM | POA: Diagnosis not present

## 2021-07-03 LAB — LIPID PANEL
Chol/HDL Ratio: 5.2 ratio — ABNORMAL HIGH (ref 0.0–4.4)
Cholesterol, Total: 203 mg/dL — ABNORMAL HIGH (ref 100–199)
HDL: 39 mg/dL — ABNORMAL LOW (ref >39)
LDL Chol Calc (NIH): 137 mg/dL — ABNORMAL HIGH (ref 0–99)
Triglycerides: 150 mg/dL — ABNORMAL HIGH (ref 0–149)
VLDL Cholesterol Cal: 27 mg/dL (ref 5–40)

## 2021-07-03 LAB — TSH: TSH: 5.41 u[IU]/mL — ABNORMAL HIGH (ref 0.450–4.500)

## 2021-08-13 ENCOUNTER — Other Ambulatory Visit: Payer: Self-pay | Admitting: Family

## 2021-09-08 ENCOUNTER — Ambulatory Visit: Payer: BC Managed Care – PPO | Admitting: Family

## 2021-09-08 ENCOUNTER — Encounter: Payer: Self-pay | Admitting: Family

## 2021-09-08 VITALS — BP 123/80 | HR 74 | Temp 98.4°F | Ht 68.0 in | Wt 161.0 lb

## 2021-09-08 DIAGNOSIS — E876 Hypokalemia: Secondary | ICD-10-CM

## 2021-09-08 DIAGNOSIS — K219 Gastro-esophageal reflux disease without esophagitis: Secondary | ICD-10-CM | POA: Diagnosis not present

## 2021-09-08 DIAGNOSIS — R7989 Other specified abnormal findings of blood chemistry: Secondary | ICD-10-CM | POA: Diagnosis not present

## 2021-09-08 DIAGNOSIS — R6889 Other general symptoms and signs: Secondary | ICD-10-CM | POA: Diagnosis not present

## 2021-09-08 NOTE — Patient Instructions (Signed)

## 2021-09-08 NOTE — Progress Notes (Signed)
Subjective:    Patient ID: April Kane, female    DOB: Sep 09, 1960, 61 y.o.   MRN: [id number]  Chief Complaint  Patient presents with   Medical Management of Chronic Issues   Pt presents to the office today to recheck TSH and potassium. Her potassium was 3.4 and currently potassium chloride 20 meq most days.  Thyroid Problem Presents for initial visit. Patient reports no anxiety, cold intolerance, dry skin, fatigue, hair loss, hoarse voice or leg swelling. The symptoms have been stable. Past treatments include nothing. The treatment provided no relief.  Gastroesophageal Reflux She complains of belching and heartburn. She reports no hoarse voice. This is a chronic problem. The current episode started more than 1 year ago. The problem occurs occasionally. The problem has been waxing and waning. Pertinent negatives include no fatigue. She has tried a PPI for the symptoms. The treatment provided moderate relief.     Review of Systems  Constitutional:  Negative for fatigue.  HENT:  Negative for hoarse voice.   Gastrointestinal:  Positive for heartburn.  Endocrine: Negative for cold intolerance.  Psychiatric/Behavioral:  The patient is not nervous/anxious.   All other systems reviewed and are negative.     Objective:   Physical Exam Vitals reviewed.  Constitutional:      General: She is not in acute distress.    Appearance: She is well-developed.  HENT:     Head: Normocephalic and atraumatic.     Right Ear: Tympanic membrane normal. There is no impacted cerumen.     Left Ear: Tympanic membrane normal.  Eyes:     Pupils: Pupils are equal, round, and reactive to light.  Neck:     Thyroid: No thyromegaly.  Cardiovascular:     Rate and Rhythm: Normal rate and regular rhythm.     Heart sounds: Normal heart sounds. No murmur heard. Pulmonary:     Effort: Pulmonary effort is normal. No respiratory distress.     Breath sounds: Normal breath sounds. No wheezing.  Abdominal:      General: Bowel sounds are normal. There is no distension.     Palpations: Abdomen is soft.     Tenderness: There is no abdominal tenderness.  Musculoskeletal:        General: No tenderness. Normal range of motion.     Cervical back: Normal range of motion and neck supple.  Skin:    General: Skin is warm and dry.  Neurological:     Mental Status: She is alert and oriented to person, place, and time.     Cranial Nerves: No cranial nerve deficit.     Deep Tendon Reflexes: Reflexes are normal and symmetric.  Psychiatric:        Behavior: Behavior normal.        Thought Content: Thought content normal.        Judgment: Judgment normal.      BP 123/80   Pulse 74   Temp 98.4 F (36.9 C)   Ht 5\' 8"  (1.727 m)   Wt 161 lb (73 kg)   LMP 04/20/2013   SpO2 96%   BMI 24.48 kg/m      Assessment & Plan:  April Kane comes in today with chief complaint of Medical Management of Chronic Issues   Diagnosis and orders addressed:  1. Abnormal TSH - TSH - BMP8+EGFR  2. Hypokalemia - BMP8+EGFR  3. Gastroesophageal reflux disease, unspecified whether esophagitis present - BMP8+EGFR   Labs pending Health Maintenance reviewed Diet and exercise encouraged  Follow up plan: 6 months    Evelina Dun, FNP

## 2021-09-09 LAB — TSH: TSH: 2.87 u[IU]/mL (ref 0.450–4.500)

## 2021-09-09 LAB — BMP8+EGFR
BUN/Creatinine Ratio: 14 (ref 9–23)
BUN: 10 mg/dL (ref 6–24)
CO2: 27 mmol/L (ref 20–29)
Calcium: 9.3 mg/dL (ref 8.7–10.2)
Chloride: 101 mmol/L (ref 96–106)
Creatinine, Ser: 0.72 mg/dL (ref 0.57–1.00)
Glucose: 124 mg/dL — ABNORMAL HIGH (ref 70–99)
Potassium: 3.5 mmol/L (ref 3.5–5.2)
Sodium: 141 mmol/L (ref 134–144)
eGFR: 97 mL/min/{1.73_m2} (ref >59)

## 2021-10-29 ENCOUNTER — Other Ambulatory Visit: Payer: Self-pay | Admitting: Family

## 2021-10-29 NOTE — Telephone Encounter (Signed)
Suspect her PCP intends for her to transition to  OTC 400 IU daily of Vit D.  Otherwise, needs Vit D level with PCP before further fills of high dose.

## 2021-11-29 ENCOUNTER — Other Ambulatory Visit: Payer: Self-pay | Admitting: Family

## 2021-11-29 DIAGNOSIS — J0110 Acute frontal sinusitis, unspecified: Secondary | ICD-10-CM

## 2022-01-09 ENCOUNTER — Telehealth: Payer: Self-pay | Admitting: Family

## 2022-01-09 NOTE — Telephone Encounter (Signed)
Patient needs appt

## 2022-01-12 ENCOUNTER — Ambulatory Visit: Payer: BC Managed Care – PPO | Admitting: Family Medicine

## 2022-01-12 ENCOUNTER — Encounter: Payer: Self-pay | Admitting: Family Medicine

## 2022-01-12 VITALS — BP 100/62 | HR 70 | Temp 97.8°F | Ht 68.0 in | Wt 158.5 lb

## 2022-01-12 DIAGNOSIS — K219 Gastro-esophageal reflux disease without esophagitis: Secondary | ICD-10-CM

## 2022-01-12 MED ORDER — OMEPRAZOLE 20 MG PO CPDR: 20.0000 mg | DELAYED_RELEASE_CAPSULE | Freq: Two times a day (BID) | ORAL | 1 refills | Status: DC

## 2022-01-12 NOTE — Patient Instructions (Signed)
Food Choices for Gastroesophageal Reflux Disease, Adult When you have gastroesophageal reflux disease (GERD), the foods you eat and your eating habits are very important. Choosing the right foods can help ease the discomfort of GERD. Consider working with a dietitian to help you make healthy food choices. What are tips for following this plan? Reading food labels Look for foods that are low in saturated fat. Foods that have less than 5% of daily value (DV) of fat and 0 g of trans fats may help with your symptoms. Cooking Cook foods using methods other than frying. This may include baking, steaming, grilling, or broiling. These are all methods that do not need a lot of fat for cooking. To add flavor, try to use herbs that are low in spice and acidity. Meal planning  Choose healthy foods that are low in fat, such as fruits, vegetables, whole grains, low-fat dairy products, lean meats, fish, and poultry. Eat frequent, small meals instead of three large meals each day. Eat your meals slowly, in a relaxed setting. Avoid bending over or lying down until 2-3 hours after eating. Limit high-fat foods such as fatty meats or fried foods. Limit your intake of fatty foods, such as oils, butter, and shortening. Avoid the following as told by your health care provider: Foods that cause symptoms. These may be different for different people. Keep a food diary to keep track of foods that cause symptoms. Alcohol. Drinking large amounts of liquid with meals. Eating meals during the 2-3 hours before bed. Lifestyle Maintain a healthy weight. Ask your health care provider what weight is healthy for you. If you need to lose weight, work with your health care provider to do so safely. Exercise for at least 30 minutes on 5 or more days each week, or as told by your health care provider. Avoid wearing clothes that fit tightly around your waist and chest. Do not use any products that contain nicotine or tobacco. These  products include cigarettes, chewing tobacco, and vaping devices, such as e-cigarettes. If you need help quitting, ask your health care provider. Sleep with the head of your bed raised. Use a wedge under the mattress or blocks under the bed frame to raise the head of the bed. Chew sugar-free gum after mealtimes. What foods should I eat?  Eat a healthy, well-balanced diet of fruits, vegetables, whole grains, low-fat dairy products, lean meats, fish, and poultry. Each person is different. Foods that may trigger symptoms in one person may not trigger any symptoms in another person. Work with your health care provider to identify foods that are safe for you. The items listed above may not be a complete list of recommended foods and beverages. Contact a dietitian for more information. What foods should I avoid? Limiting some of these foods may help manage the symptoms of GERD. Everyone is different. Consult a dietitian or your health care provider to help you identify the exact foods to avoid, if any. Fruits Any fruits prepared with added fat. Any fruits that cause symptoms. For some people this may include citrus fruits, such as oranges, grapefruit, pineapple, and lemons. Vegetables Deep-fried vegetables. French fries. Any vegetables prepared with added fat. Any vegetables that cause symptoms. For some people, this may include tomatoes and tomato products, chili peppers, onions and garlic, and horseradish. Grains Pastries or quick breads with added fat. Meats and other proteins High-fat meats, such as fatty beef or pork, hot dogs, ribs, ham, sausage, salami, and bacon. Fried meat or protein, including   fried fish and fried chicken. Nuts and nut butters, in large amounts. Dairy Whole milk and chocolate milk. Sour cream. Cream. Ice cream. Cream cheese. Milkshakes. Fats and oils Butter. Margarine. Shortening. Ghee. Beverages Coffee and tea, with or without caffeine. Carbonated beverages. Sodas. Energy  drinks. Fruit juice made with acidic fruits, such as orange or grapefruit. Tomato juice. Alcoholic drinks. Sweets and desserts Chocolate and cocoa. Donuts. Seasonings and condiments Pepper. Peppermint and spearmint. Added salt. Any condiments, herbs, or seasonings that cause symptoms. For some people, this may include curry, hot sauce, or vinegar-based salad dressings. The items listed above may not be a complete list of foods and beverages to avoid. Contact a dietitian for more information. Questions to ask your health care provider Diet and lifestyle changes are usually the first steps that are taken to manage symptoms of GERD. If diet and lifestyle changes do not improve your symptoms, talk with your health care provider about taking medicines. Where to find more information [employer]: aboutgerd.org Summary When you have gastroesophageal reflux disease (GERD), food and lifestyle choices may be very helpful in easing the discomfort of GERD. Eat frequent, small meals instead of three large meals each day. Eat your meals slowly, in a relaxed setting. Avoid bending over or lying down until 2-3 hours after eating. Limit high-fat foods such as fatty meats or fried foods. This information is not intended to replace advice given to you by your health care provider. Make sure you discuss any questions you have with your health care provider. Document Revised: 10/16/2019 Document Reviewed: 10/16/2019 Elsevier Patient Education  2023 Elsevier Inc.  

## 2022-01-12 NOTE — Progress Notes (Signed)
   Acute Office Visit  Subjective:     Patient ID: April Kane, female    DOB: May 08, 1960, 61 y.o.   MRN: [id number]  Chief Complaint  Patient presents with   Gastroesophageal Reflux    Gastroesophageal Reflux   Patient is in today for increase heartburn for 3-4 weeks. She has intermittent epigastric pain. She has intermittent nausea as well. Sometime this wakes her up in the middle of the night and is quite severe. Also reports increased belching. She denies vomiting. She denies fever, weight loss, diarrhea, or changes in bowel habits. She takes omeprazole 20 mg daily. She has been on this for years.   ROS As per HPI.      Objective:    BP 100/62   Pulse 70   Temp 97.8 F (36.6 C) (Temporal)   Ht 5\' 8"  (1.727 m)   Wt 158 lb 8 oz (71.9 kg)   LMP 04/20/2013   SpO2 96%   BMI 24.10 kg/m    Physical Exam Vitals and nursing note reviewed.  Constitutional:      General: She is not in acute distress.    Appearance: She is not ill-appearing, toxic-appearing or diaphoretic.  Pulmonary:     Effort: Pulmonary effort is normal. No respiratory distress.  Abdominal:     General: Bowel sounds are normal. There is no distension.     Palpations: Abdomen is soft.     Tenderness: There is no abdominal tenderness. There is no guarding or rebound. Negative signs include Murphy's sign and McBurney's sign.  Musculoskeletal:     Right lower leg: No edema.     Left lower leg: No edema.  Skin:    General: Skin is warm and dry.  Neurological:     General: No focal deficit present.     Mental Status: She is alert and oriented to person, place, and time.  Psychiatric:        Mood and Affect: Mood normal.        Behavior: Behavior normal.     No results found for any visits on 01/12/22.      Assessment & Plan:   Analisse was seen today for gastroesophageal reflux.  Diagnoses and all orders for this visit:  Gastroesophageal reflux disease, unspecified whether esophagitis  present Chronic, uncontrolled. No red flags. Increase omeprazole to BID. Discussed lifestyle and diet management.  -     omeprazole (PRILOSEC) 20 MG capsule; Take 1 capsule (20 mg total) by mouth 2 (two) times daily before a meal.  Return if symptoms worsen or fail to improve.  The patient indicates understanding of these issues and agrees with the plan.  Gwenlyn Perking, FNP

## 2022-01-22 ENCOUNTER — Other Ambulatory Visit: Payer: Self-pay | Admitting: Family

## 2022-01-22 NOTE — Telephone Encounter (Signed)
Do you want her to continue on Rx Vit D or change to OTC?

## 2022-04-19 ENCOUNTER — Other Ambulatory Visit: Payer: Self-pay | Admitting: Family

## 2022-05-01 IMAGING — MG MM DIGITAL SCREENING BILAT W/ TOMO AND CAD
8 series · 9 of 24 positions shown · non-contrast
Comparison: Previous exam(s).

CLINICAL DATA: Screening.

EXAM:
DIGITAL SCREENING BILATERAL MAMMOGRAM WITH TOMOSYNTHESIS AND CAD
TECHNIQUE: Bilateral screening digital craniocaudal and mediolateral oblique
mammograms were obtained. Bilateral screening digital breast
tomosynthesis was performed. The images were evaluated with
computer-aided detection.

[R CC synth-2D]
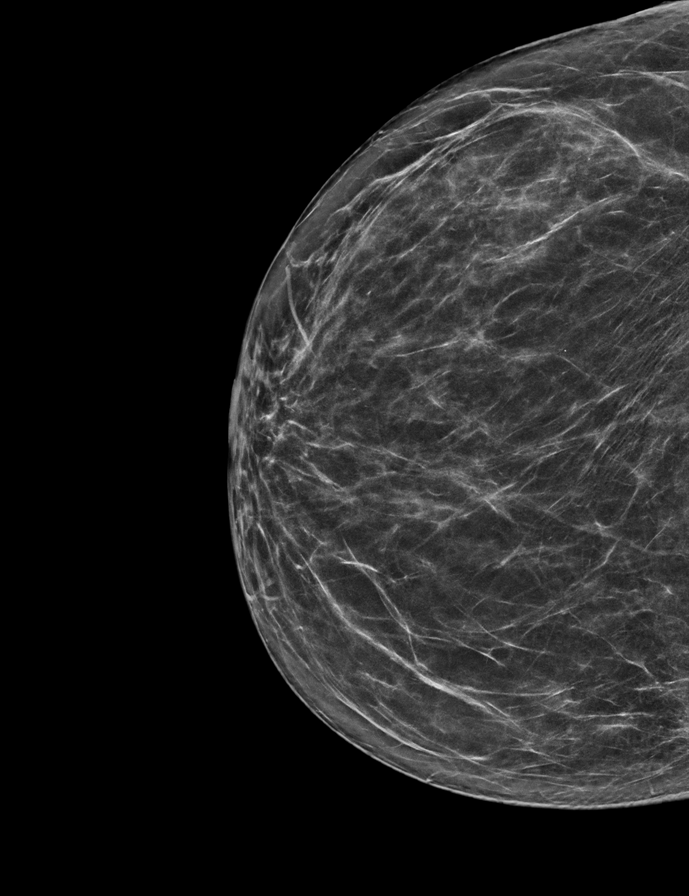

[R MLO synth-2D]
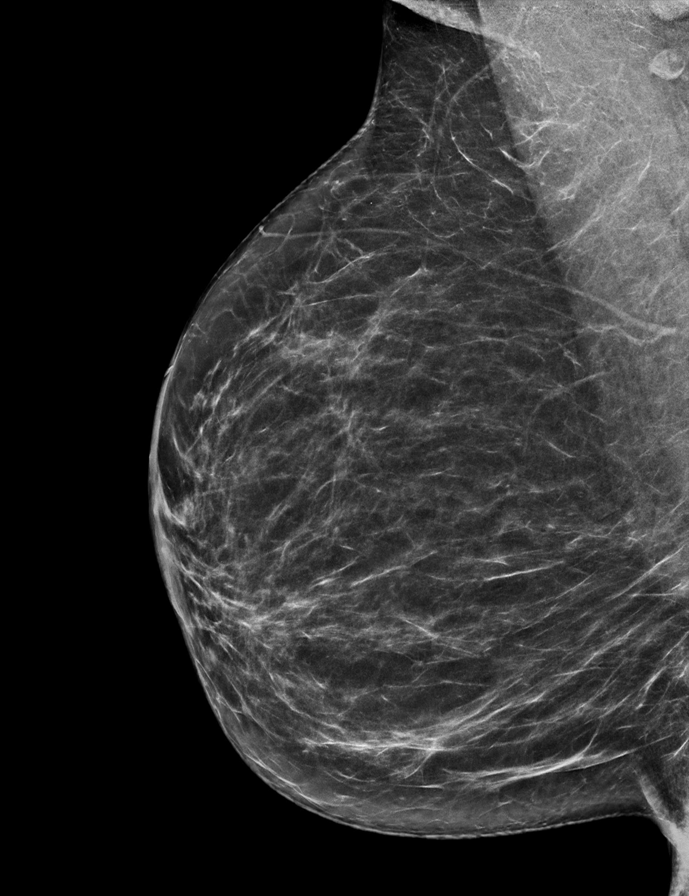

[L MLO synth-2D]
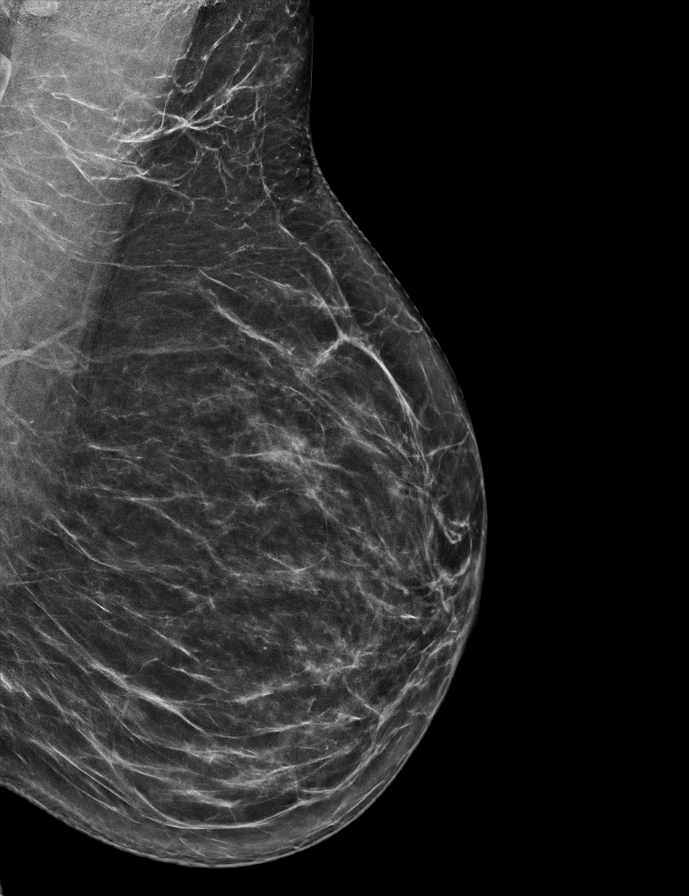

[L CC synth-2D]
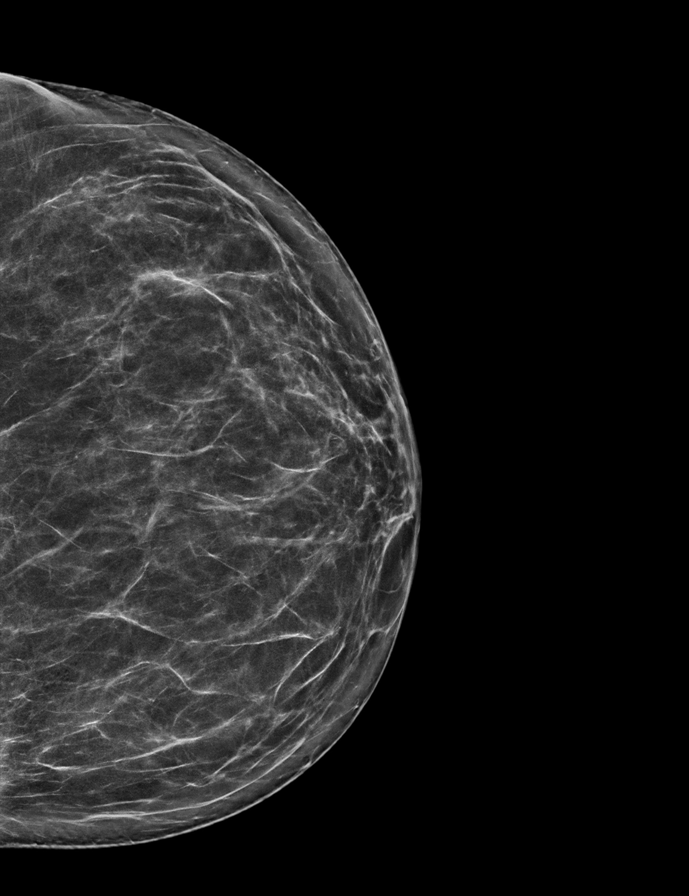

[R CC tomo · 2 of 63 frames shown]
[frame 21/63]
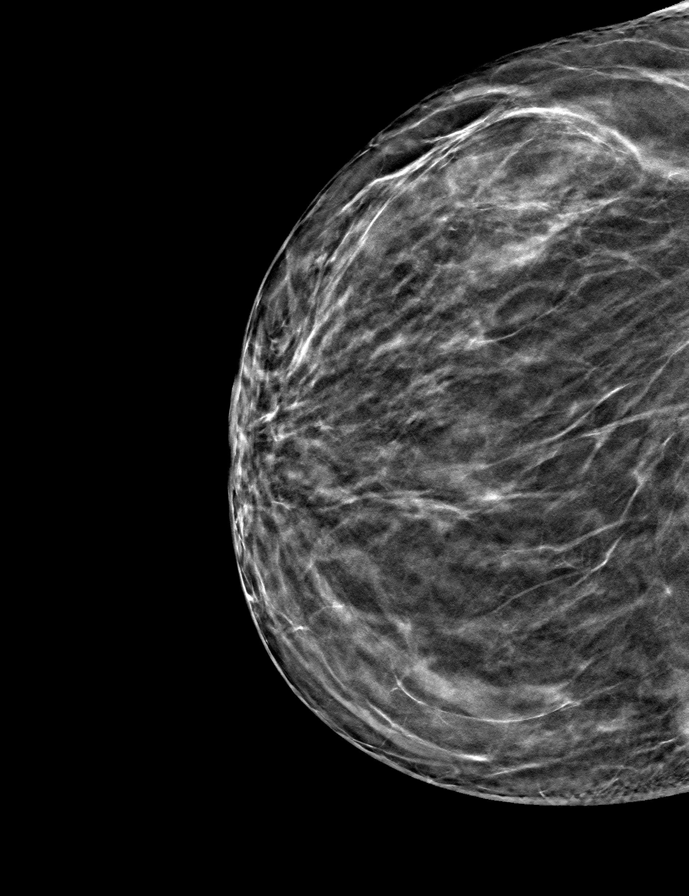
[frame 32/63]
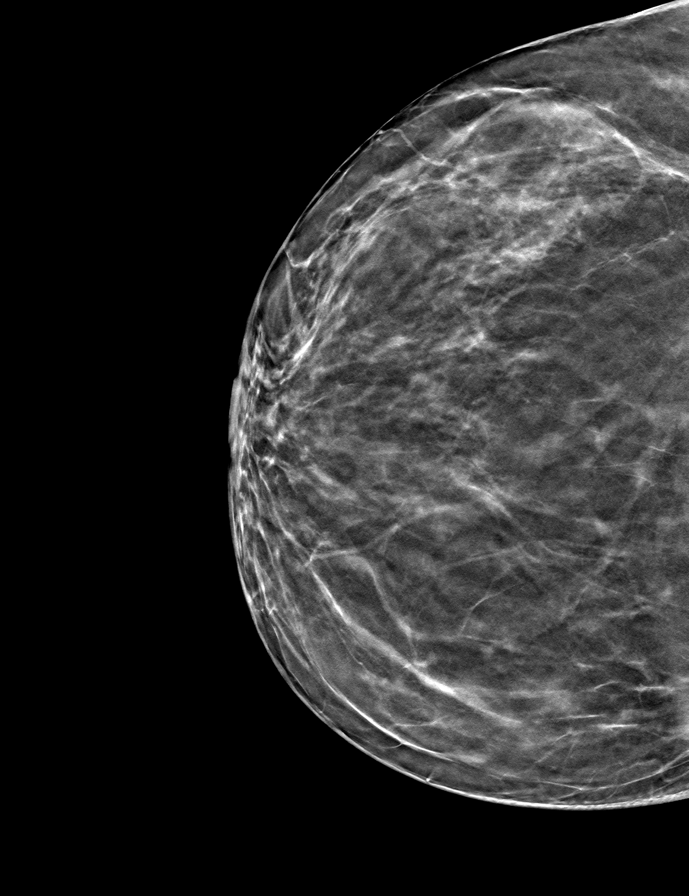

[L MLO tomo · tomo slice 37/72.0]
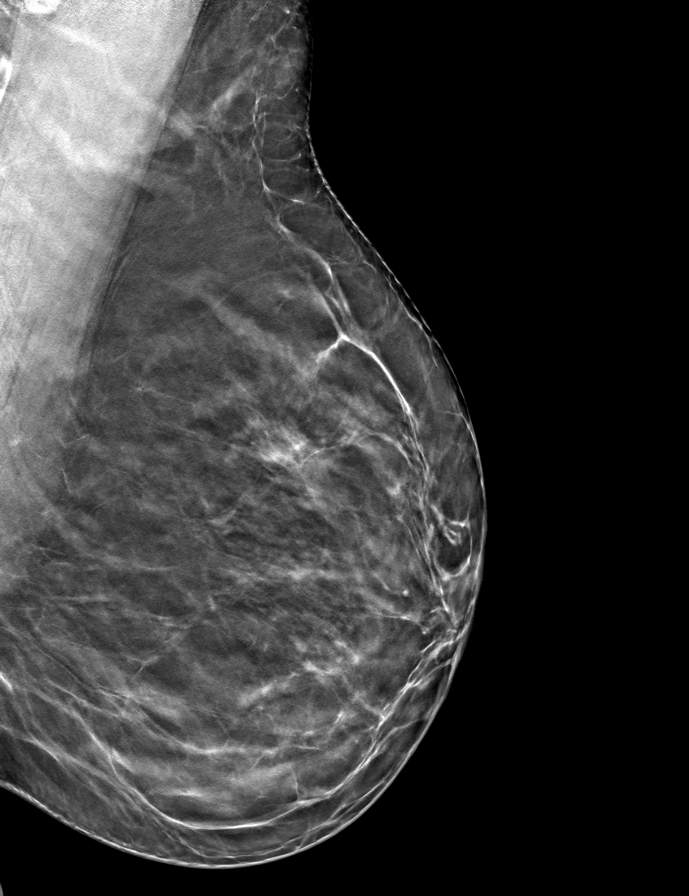

[R MLO tomo · tomo slice 35/69.0]
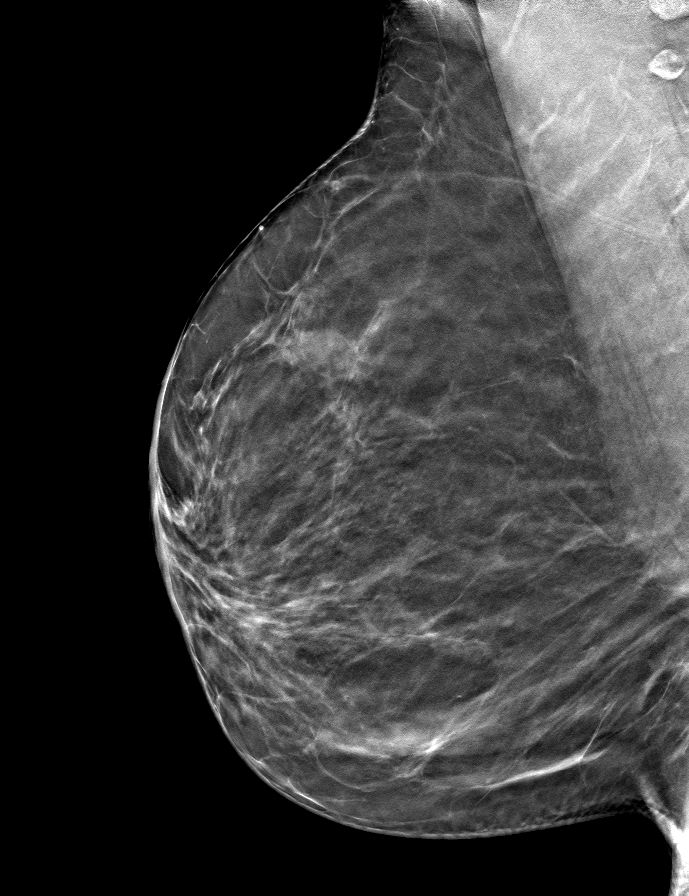

[L CC tomo · tomo slice 34/67.0]
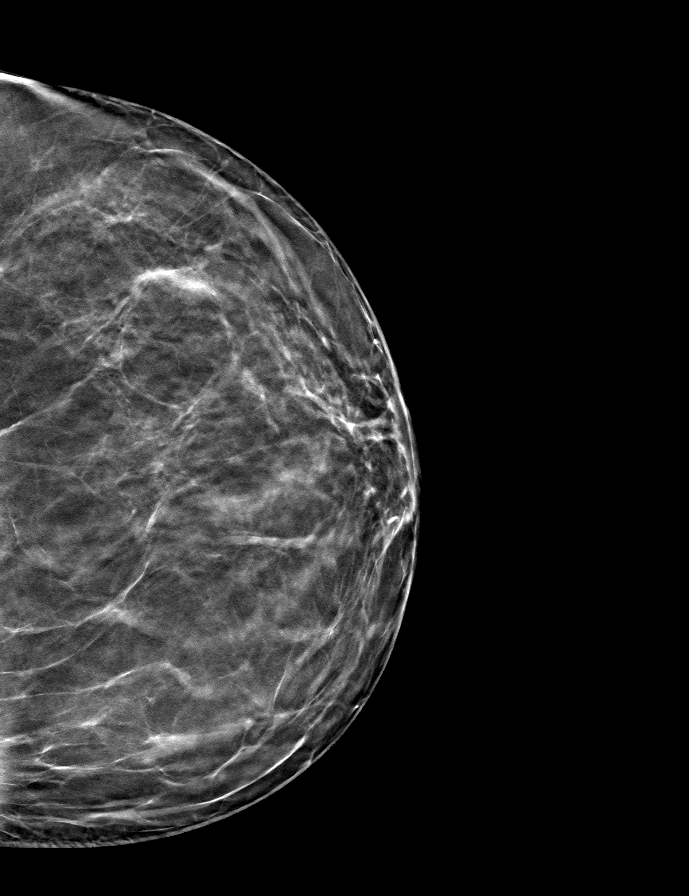

[9 of 24 positions shown; findings below may reference images not displayed]

ACR Breast Density Category b: There are scattered areas of
fibroglandular density.
FINDINGS: There are no findings suspicious for malignancy.
IMPRESSION: No mammographic evidence of malignancy. A result letter of this
screening mammogram will be mailed directly to the patient.

RECOMMENDATION:
Screening mammogram in one year. (Code:51-O-LD2)

BI-RADS CATEGORY  1: Negative.

## 2022-06-30 ENCOUNTER — Other Ambulatory Visit: Payer: Self-pay | Admitting: Family

## 2022-06-30 DIAGNOSIS — I10 Essential (primary) hypertension: Secondary | ICD-10-CM

## 2022-06-30 DIAGNOSIS — M797 Fibromyalgia: Secondary | ICD-10-CM

## 2022-07-19 ENCOUNTER — Other Ambulatory Visit: Payer: Self-pay | Admitting: Family

## 2022-07-19 DIAGNOSIS — J0110 Acute frontal sinusitis, unspecified: Secondary | ICD-10-CM

## 2022-07-22 ENCOUNTER — Encounter: Payer: Self-pay | Admitting: Family Medicine

## 2022-07-22 ENCOUNTER — Ambulatory Visit: Payer: BC Managed Care – PPO | Admitting: Family Medicine

## 2022-07-22 ENCOUNTER — Telehealth (INDEPENDENT_AMBULATORY_CARE_PROVIDER_SITE_OTHER): Payer: BC Managed Care – PPO | Admitting: Family Medicine

## 2022-07-22 DIAGNOSIS — J40 Bronchitis, not specified as acute or chronic: Secondary | ICD-10-CM

## 2022-07-22 MED ORDER — AMOXICILLIN-POT CLAVULANATE 875-125 MG PO TABS: 1.0000 | ORAL_TABLET | Freq: Two times a day (BID) | ORAL | 0 refills | Status: DC

## 2022-07-22 NOTE — Progress Notes (Signed)
Virtual Visit via MyChart video note  I connected with Dyke Brackett on 07/22/22 at 1255 by video and verified that I am speaking with the correct person using two identifiers. April Kane is currently located at home and patient are currently with her during visit. The provider, Fransisca Kaufmann Kaija Kovacevic, MD is located in their office at time of visit.  Call ended at 1305  I discussed the limitations, risks, security and privacy concerns of performing an evaluation and management service by video and the availability of in person appointments. I also discussed with the patient that there may be a patient responsible charge related to this service. The patient expressed understanding and agreed to proceed.   History and Present Illness: Patient is calling in for sore throat and congestion and sinus pressure and down in her chest.  She gets coughing fits. She feels like it is bronchitis.  She has been having this for 2 weeks.  Her energy is down.  She is using robitussin and halls and motrin and they help a little. She denies any fevers or chills or SOB or wheezing. She is hoarse at times.  She is just not getting better.  She denies sick contacts.   1. Bronchitis     Outpatient Encounter Medications as of 07/22/2022  Medication Sig   amoxicillin-clavulanate (AUGMENTIN) 875-125 MG tablet Take 1 tablet by mouth 2 (two) times daily.   amitriptyline (ELAVIL) 100 MG tablet Take 1 tablet (100 mg total) by mouth at bedtime. (NEEDS TO BE SEEN BEFORE NEXT REFILL)   atenolol (TENORMIN) 50 MG tablet Take 1 tablet (50 mg total) by mouth daily. (NEEDS TO BE SEEN BEFORE NEXT REFILL)   fluticasone (FLONASE) 50 MCG/ACT nasal spray Place 2 sprays into both nostrils daily. (NEEDS TO BE SEEN BEFORE NEXT REFILL)   hydrochlorothiazide (HYDRODIURIL) 25 MG tablet Take 1 tablet (25 mg total) by mouth daily.   omeprazole (PRILOSEC) 20 MG capsule Take 1 capsule (20 mg total) by mouth 2 (two) times daily before a meal.    potassium chloride SA (KLOR-CON M) 20 MEQ tablet Take 1 tablet (20 mEq total) by mouth daily. (Patient not taking: Reported on 01/12/2022)   Vitamin D, Ergocalciferol, (DRISDOL) 1.25 MG (50000 UNIT) CAPS capsule Take 1 capsule by mouth once a week   No facility-administered encounter medications on file as of 07/22/2022.    Review of Systems  Constitutional:  Negative for chills and fever.  HENT:  Positive for congestion, postnasal drip, rhinorrhea and sinus pressure. Negative for ear discharge, ear pain, sneezing and sore throat.   Eyes:  Negative for pain, redness and visual disturbance.  Respiratory:  Positive for cough. Negative for chest tightness, shortness of breath and wheezing.   Cardiovascular:  Negative for chest pain and leg swelling.  Genitourinary:  Negative for difficulty urinating and dysuria.  Musculoskeletal:  Negative for back pain and gait problem.  Skin:  Negative for rash.  Neurological:  Negative for light-headedness and headaches.  Psychiatric/Behavioral:  Negative for agitation and behavioral problems.   All other systems reviewed and are negative.   Observations/Objective: Patient sounds comfortable and in no acute distress  Assessment and Plan: Problem List Items Addressed This Visit   None Visit Diagnoses     Bronchitis    -  Primary   Relevant Medications   amoxicillin-clavulanate (AUGMENTIN) 875-125 MG tablet     Sent amoxicillin clavulanic acid for the patient sounds like a bronchitis that is persistent.  Recommend that she continue  with the Robitussin and could maybe take Mucinex and Hall's and Motrin  Follow up plan: Return if symptoms worsen or fail to improve.     I discussed the assessment and treatment plan with the patient. The patient was provided an opportunity to ask questions and all were answered. The patient agreed with the plan and demonstrated an understanding of the instructions.   The patient was advised to call back or seek an  in-person evaluation if the symptoms worsen or if the condition fails to improve as anticipated.  The above assessment and management plan was discussed with the patient. The patient verbalized understanding of and has agreed to the management plan. Patient is aware to call the clinic if symptoms persist or worsen. Patient is aware when to return to the clinic for a follow-up visit. Patient educated on when it is appropriate to go to the emergency department.    I provided 10 minutes of non-face-to-face time during this encounter.    Worthy Rancher, MD

## 2022-08-03 ENCOUNTER — Other Ambulatory Visit: Payer: Self-pay | Admitting: Family

## 2022-08-03 DIAGNOSIS — I10 Essential (primary) hypertension: Secondary | ICD-10-CM

## 2022-08-03 DIAGNOSIS — M797 Fibromyalgia: Secondary | ICD-10-CM

## 2022-08-05 ENCOUNTER — Other Ambulatory Visit: Payer: Self-pay | Admitting: Family

## 2022-08-05 DIAGNOSIS — M797 Fibromyalgia: Secondary | ICD-10-CM

## 2022-08-07 DIAGNOSIS — J029 Acute pharyngitis, unspecified: Secondary | ICD-10-CM | POA: Diagnosis not present

## 2022-08-10 ENCOUNTER — Ambulatory Visit: Payer: BC Managed Care – PPO | Admitting: Family Medicine

## 2022-08-14 ENCOUNTER — Ambulatory Visit (INDEPENDENT_AMBULATORY_CARE_PROVIDER_SITE_OTHER): Payer: BC Managed Care – PPO

## 2022-08-14 ENCOUNTER — Ambulatory Visit (INDEPENDENT_AMBULATORY_CARE_PROVIDER_SITE_OTHER): Payer: BC Managed Care – PPO | Admitting: Family

## 2022-08-14 ENCOUNTER — Encounter: Payer: Self-pay | Admitting: Family

## 2022-08-14 VITALS — BP 103/68 | HR 68 | Temp 97.1°F | Ht 68.0 in | Wt 163.2 lb

## 2022-08-14 DIAGNOSIS — R7303 Prediabetes: Secondary | ICD-10-CM | POA: Diagnosis not present

## 2022-08-14 DIAGNOSIS — Z0000 Encounter for general adult medical examination without abnormal findings: Secondary | ICD-10-CM

## 2022-08-14 DIAGNOSIS — E785 Hyperlipidemia, unspecified: Secondary | ICD-10-CM | POA: Diagnosis not present

## 2022-08-14 DIAGNOSIS — R051 Acute cough: Secondary | ICD-10-CM

## 2022-08-14 DIAGNOSIS — M19011 Primary osteoarthritis, right shoulder: Secondary | ICD-10-CM

## 2022-08-14 DIAGNOSIS — K219 Gastro-esophageal reflux disease without esophagitis: Secondary | ICD-10-CM | POA: Diagnosis not present

## 2022-08-14 DIAGNOSIS — J9811 Atelectasis: Secondary | ICD-10-CM | POA: Diagnosis not present

## 2022-08-14 DIAGNOSIS — I10 Essential (primary) hypertension: Secondary | ICD-10-CM | POA: Diagnosis not present

## 2022-08-14 DIAGNOSIS — R059 Cough, unspecified: Secondary | ICD-10-CM | POA: Diagnosis not present

## 2022-08-14 DIAGNOSIS — Z0001 Encounter for general adult medical examination with abnormal findings: Secondary | ICD-10-CM | POA: Diagnosis not present

## 2022-08-14 DIAGNOSIS — E559 Vitamin D deficiency, unspecified: Secondary | ICD-10-CM

## 2022-08-14 LAB — BAYER DCA HB A1C WAIVED: HB A1C (BAYER DCA - WAIVED): 5.7 % — ABNORMAL HIGH (ref 4.8–5.6)

## 2022-08-14 NOTE — Progress Notes (Signed)
Subjective:    Patient ID: April Kane, female    DOB: [birth year], 62 y.o.   MRN: [id number]  Chief Complaint  Patient presents with   Annual Exam   Pt presents to the office today for CPE.  Reports Christmas day she lost her dog and has had a hard time with this.   Pt has prediabetes and tries to be on low carb.   She reports mild cough that has improved since completing antibiotics. Still requesting chest x-ray today.  Hypertension This is a chronic problem. The current episode started more than 1 year ago. The problem has been resolved since onset. The problem is controlled. Associated symptoms include malaise/fatigue. Pertinent negatives include no peripheral edema or shortness of breath. Risk factors for coronary artery disease include dyslipidemia and obesity. The current treatment provides moderate improvement.  Gastroesophageal Reflux She complains of belching and heartburn. This is a chronic problem. The current episode started more than 1 year ago. The problem occurs occasionally. The symptoms are aggravated by certain foods. Risk factors include obesity. She has tried a PPI for the symptoms. The treatment provided moderate relief.  Arthritis Presents for follow-up visit. She complains of pain and stiffness. The symptoms have been stable. Affected locations include the right knee, left knee and right shoulder (back). Her pain is at a severity of 5/10.  Hyperlipidemia This is a chronic problem. The current episode started more than 1 year ago. The problem is uncontrolled. Recent lipid tests were reviewed and are high. Pertinent negatives include no shortness of breath. Current antihyperlipidemic treatment includes diet change. The current treatment provides mild improvement of lipids. Risk factors for coronary artery disease include dyslipidemia, hypertension, a sedentary lifestyle and post-menopausal.      Review of Systems  Constitutional:  Positive for malaise/fatigue.   Respiratory:  Negative for shortness of breath.   Gastrointestinal:  Positive for heartburn.  Musculoskeletal:  Positive for arthritis and stiffness.  All other systems reviewed and are negative.      Objective:   Physical Exam Vitals reviewed.  Constitutional:      General: She is not in acute distress.    Appearance: She is well-developed.  HENT:     Head: Normocephalic and atraumatic.     Right Ear: Tympanic membrane normal.     Left Ear: Tympanic membrane normal.  Eyes:     Pupils: Pupils are equal, round, and reactive to light.  Neck:     Thyroid: No thyromegaly.  Cardiovascular:     Rate and Rhythm: Normal rate and regular rhythm.     Heart sounds: Normal heart sounds. No murmur heard. Pulmonary:     Effort: Pulmonary effort is normal. No respiratory distress.     Breath sounds: Normal breath sounds. No wheezing.  Abdominal:     General: Bowel sounds are normal. There is no distension.     Palpations: Abdomen is soft.     Tenderness: There is no abdominal tenderness.  Musculoskeletal:        General: No tenderness. Normal range of motion.     Cervical back: Normal range of motion and neck supple.  Skin:    General: Skin is warm and dry.  Neurological:     Mental Status: She is alert and oriented to person, place, and time.     Cranial Nerves: No cranial nerve deficit.     Deep Tendon Reflexes: Reflexes are normal and symmetric.  Psychiatric:        Behavior: Behavior  normal.        Thought Content: Thought content normal.        Judgment: Judgment normal.       BP 103/68   Pulse 68   Temp (!) 97.1 F (36.2 C) (Temporal)   Ht 5\' 8"  (1.727 m)   Wt 163 lb 3.2 oz (74 kg)   LMP 04/20/2013   SpO2 96%   BMI 24.81 kg/m      Assessment & Plan:  April Kane comes in today with chief complaint of Annual Exam   Diagnosis and orders addressed:  1. Annual physical exam - CMP14+EGFR - CBC with Differential/Platelet - Lipid panel - TSH - DG Chest 2  View  2. Essential hypertension - CMP14+EGFR - CBC with Differential/Platelet  3. Gastroesophageal reflux disease, unspecified whether esophagitis present - CMP14+EGFR - CBC with Differential/Platelet  4. Hyperlipidemia, unspecified hyperlipidemia type - CMP14+EGFR - CBC with Differential/Platelet  5. Vitamin D deficiency - CMP14+EGFR - CBC with Differential/Platelet  6. Acute cough - CMP14+EGFR - DG Chest 2 View  7. Prediabetes - Bayer DCA Hb A1c Waived  8. Primary osteoarthritis of right shoulder   Labs pending Health Maintenance reviewed Diet and exercise encouraged  Follow up plan: 1 year    April Rodney, FNP

## 2022-08-14 NOTE — Patient Instructions (Signed)

## 2022-08-15 LAB — CBC WITH DIFFERENTIAL/PLATELET
Basophils Absolute: 0 10*3/uL (ref 0.0–0.2)
Basos: 0 %
EOS (ABSOLUTE): 0.3 10*3/uL (ref 0.0–0.4)
Eos: 5 %
Hematocrit: 43.4 % (ref 34.0–46.6)
Hemoglobin: 14.4 g/dL (ref 11.1–15.9)
Immature Grans (Abs): 0 10*3/uL (ref 0.0–0.1)
Immature Granulocytes: 0 %
Lymphocytes Absolute: 1.9 10*3/uL (ref 0.7–3.1)
Lymphs: 30 %
MCH: 28.3 pg (ref 26.6–33.0)
MCHC: 33.2 g/dL (ref 31.5–35.7)
MCV: 85 fL (ref 79–97)
Monocytes Absolute: 0.3 10*3/uL (ref 0.1–0.9)
Monocytes: 5 %
Neutrophils Absolute: 3.7 10*3/uL (ref 1.4–7.0)
Neutrophils: 60 %
Platelets: 306 10*3/uL (ref 150–450)
RBC: 5.08 x10E6/uL (ref 3.77–5.28)
RDW: 12.9 % (ref 11.7–15.4)
WBC: 6.2 10*3/uL (ref 3.4–10.8)

## 2022-08-15 LAB — CMP14+EGFR
ALT: 76 IU/L — ABNORMAL HIGH (ref 0–32)
AST: 62 IU/L — ABNORMAL HIGH (ref 0–40)
Albumin/Globulin Ratio: 1.3 (ref 1.2–2.2)
Albumin: 4 g/dL (ref 3.8–4.9)
Alkaline Phosphatase: 113 IU/L (ref 44–121)
BUN/Creatinine Ratio: 9 (ref 9–23)
BUN: 7 mg/dL (ref 6–24)
Bilirubin Total: 0.3 mg/dL (ref 0.0–1.2)
CO2: 26 mmol/L (ref 20–29)
Calcium: 9.3 mg/dL (ref 8.7–10.2)
Chloride: 99 mmol/L (ref 96–106)
Creatinine, Ser: 0.82 mg/dL (ref 0.57–1.00)
Globulin, Total: 3 g/dL (ref 1.5–4.5)
Glucose: 123 mg/dL — ABNORMAL HIGH (ref 70–99)
Potassium: 3.2 mmol/L — ABNORMAL LOW (ref 3.5–5.2)
Sodium: 141 mmol/L (ref 134–144)
Total Protein: 7 g/dL (ref 6.0–8.5)
eGFR: 82 mL/min/{1.73_m2} (ref >59)

## 2022-08-15 LAB — LIPID PANEL
Chol/HDL Ratio: 4.4 ratio (ref 0.0–4.4)
Cholesterol, Total: 183 mg/dL (ref 100–199)
HDL: 42 mg/dL (ref >39)
LDL Chol Calc (NIH): 110 mg/dL — ABNORMAL HIGH (ref 0–99)
Triglycerides: 176 mg/dL — ABNORMAL HIGH (ref 0–149)
VLDL Cholesterol Cal: 31 mg/dL (ref 5–40)

## 2022-08-15 LAB — TSH: TSH: 3.15 u[IU]/mL (ref 0.450–4.500)

## 2022-08-17 ENCOUNTER — Other Ambulatory Visit: Payer: Self-pay | Admitting: Family

## 2022-08-17 DIAGNOSIS — R918 Other nonspecific abnormal finding of lung field: Secondary | ICD-10-CM

## 2022-08-17 MED ORDER — POTASSIUM CHLORIDE CRYS ER 20 MEQ PO TBCR: 20.0000 meq | EXTENDED_RELEASE_TABLET | Freq: Every day | ORAL | 2 refills | Status: DC

## 2022-08-18 ENCOUNTER — Other Ambulatory Visit: Payer: Self-pay | Admitting: Family

## 2022-08-18 DIAGNOSIS — I10 Essential (primary) hypertension: Secondary | ICD-10-CM

## 2022-08-18 MED ORDER — POTASSIUM CHLORIDE CRYS ER 20 MEQ PO TBCR: 20.0000 meq | EXTENDED_RELEASE_TABLET | Freq: Every day | ORAL | 3 refills | Status: DC

## 2022-08-18 NOTE — Addendum Note (Signed)
Addended by: Jannifer Rodney A on: 08/18/2022 10:22 AM   Modules accepted: Orders

## 2022-08-30 ENCOUNTER — Ambulatory Visit (HOSPITAL_BASED_OUTPATIENT_CLINIC_OR_DEPARTMENT_OTHER)
Admission: RE | Admit: 2022-08-30 | Discharge: 2022-08-30 | Disposition: A | Discharge status: Home / Self Care | Payer: BC Managed Care – PPO | Source: Ambulatory Visit | Attending: Family | Admitting: Family

## 2022-08-30 DIAGNOSIS — R918 Other nonspecific abnormal finding of lung field: Secondary | ICD-10-CM | POA: Insufficient documentation

## 2022-08-30 MED ORDER — IOHEXOL 300 MG/ML  SOLN
100.0000 mL | Freq: Once | INTRAMUSCULAR | Status: AC | PRN
  Administered 2022-08-30: 75 mL via INTRAVENOUS

## 2022-08-31 ENCOUNTER — Other Ambulatory Visit: Payer: Self-pay | Admitting: Family

## 2022-08-31 DIAGNOSIS — I10 Essential (primary) hypertension: Secondary | ICD-10-CM

## 2022-08-31 DIAGNOSIS — M797 Fibromyalgia: Secondary | ICD-10-CM

## 2022-09-04 ENCOUNTER — Telehealth: Payer: Self-pay | Admitting: Family

## 2022-09-04 ENCOUNTER — Other Ambulatory Visit: Payer: Self-pay | Admitting: Nurse Practitioner

## 2022-09-04 NOTE — Telephone Encounter (Signed)
Pt aware and verbalized understanding. Everything normal per mmm

## 2022-09-05 ENCOUNTER — Other Ambulatory Visit: Payer: Self-pay | Admitting: Nurse Practitioner

## 2022-09-06 IMAGING — US US ABDOMEN COMPLETE
1 series · 13 of 25 positions shown · non-contrast
Comparison: None.

CLINICAL DATA: Elevated liver enzymes.

EXAM:
ABDOMEN ULTRASOUND COMPLETE

[Series 1: us abdomen complete · 13 of 136 slices shown]
[im 1/136]
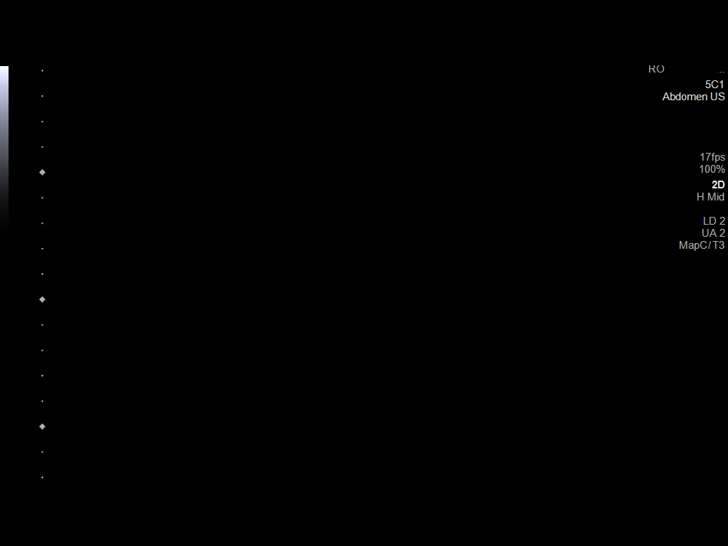
[im 12/136]
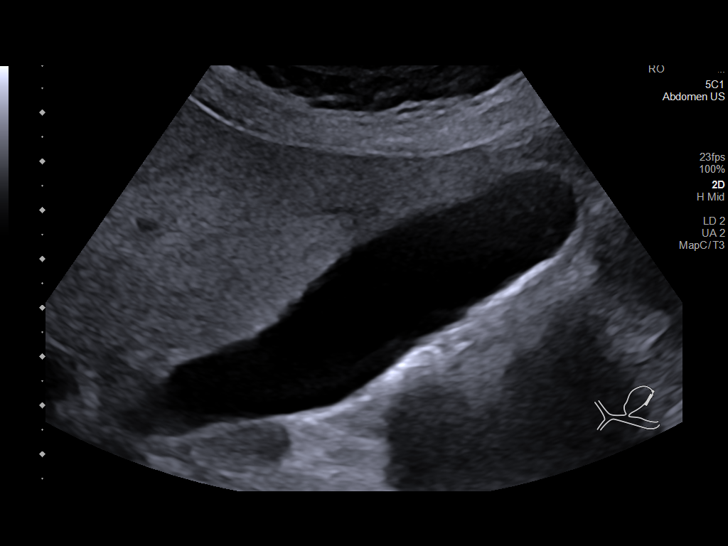
[im 23/136]
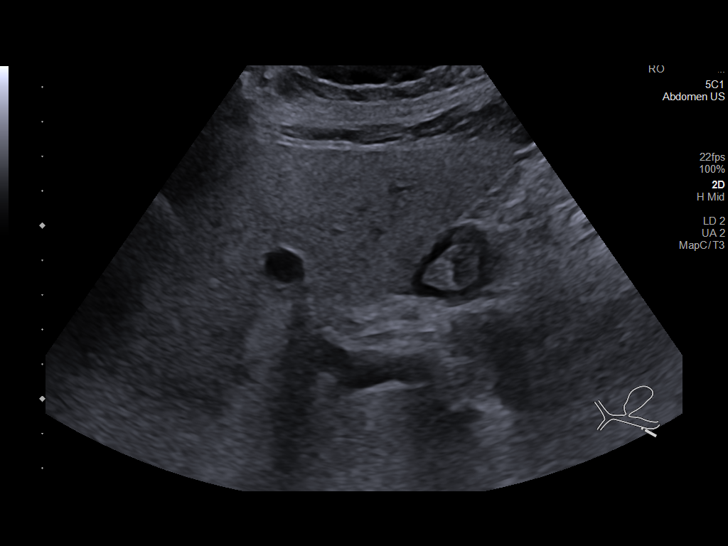
[im 34/136]
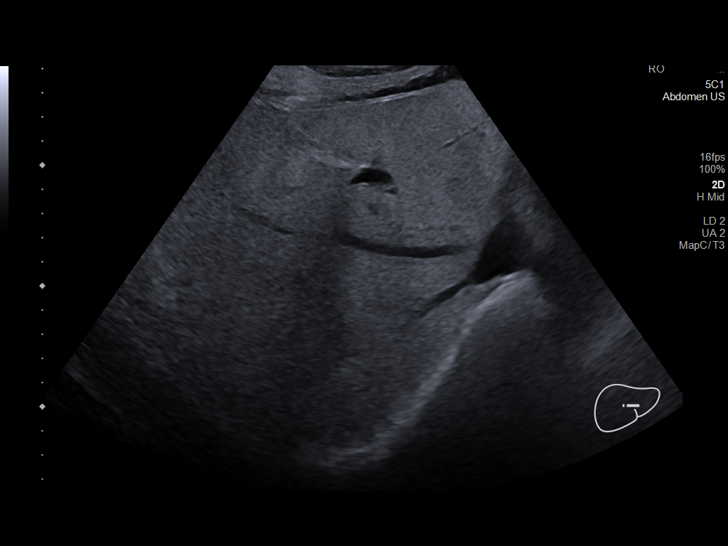
[im 46/136]
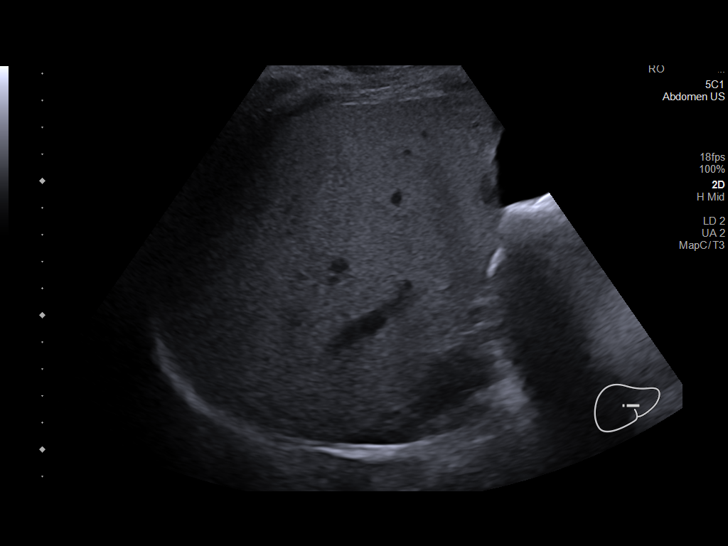
[im 57/136]
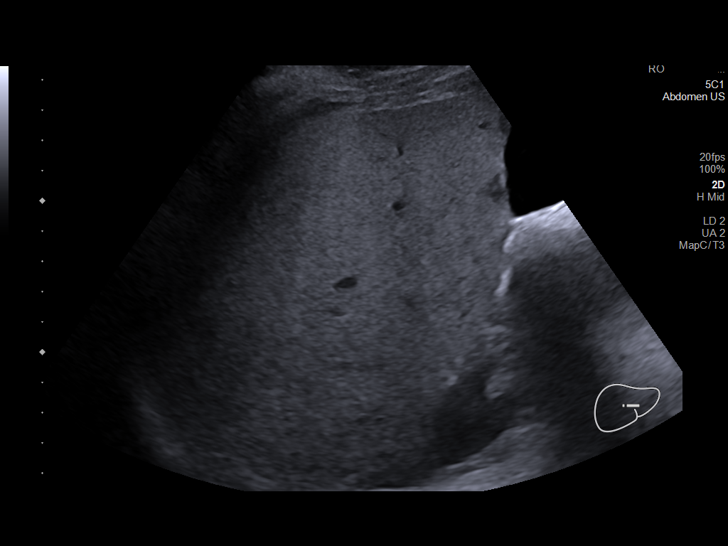
[im 68/136]
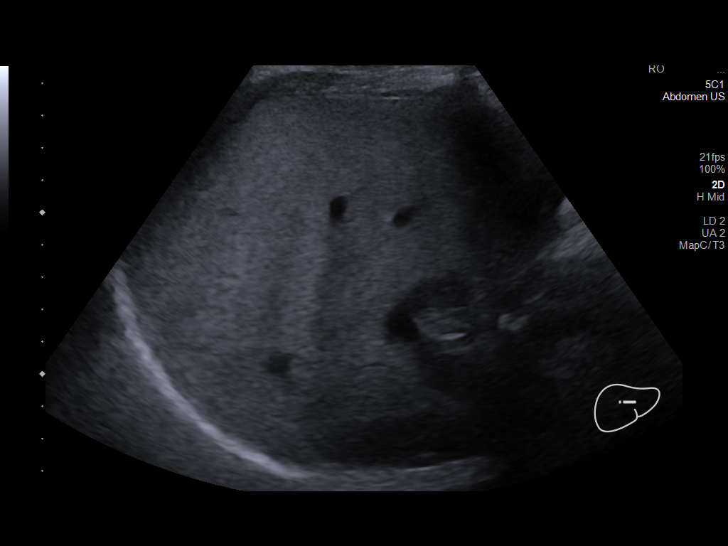
[im 79/136]
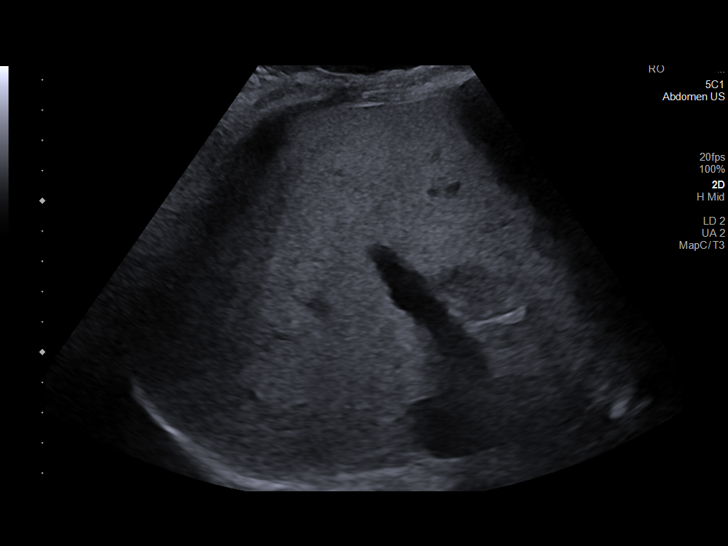
[im 91/136]
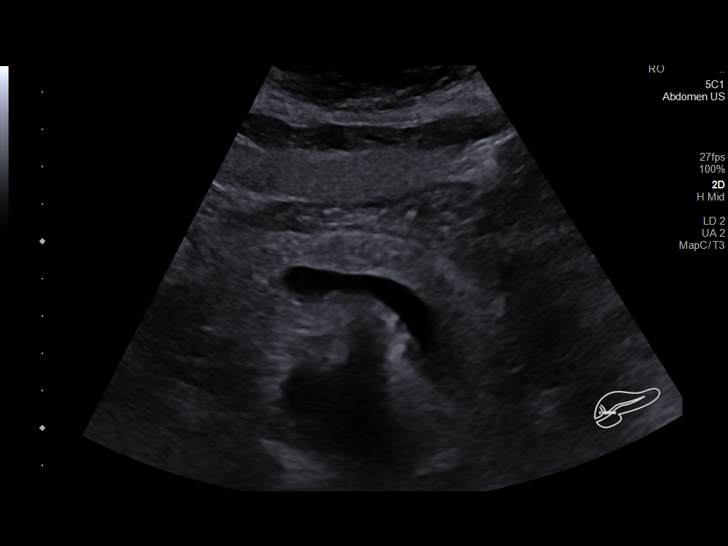
[im 102/136]
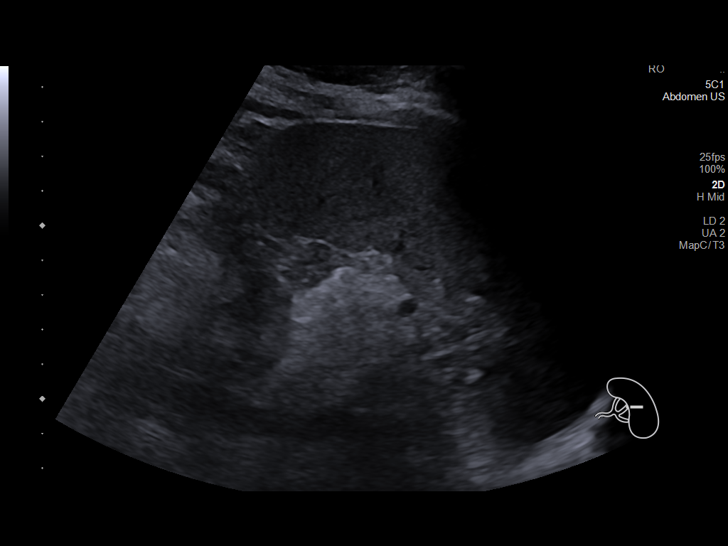
[im 113/136]
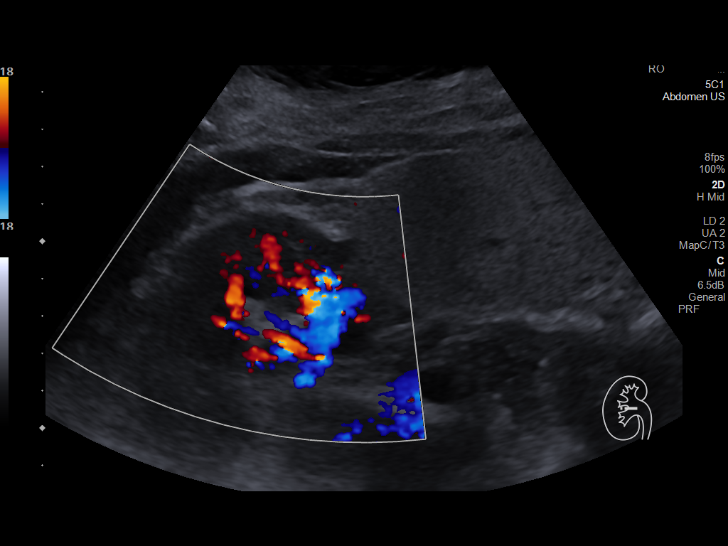
[im 124/136]
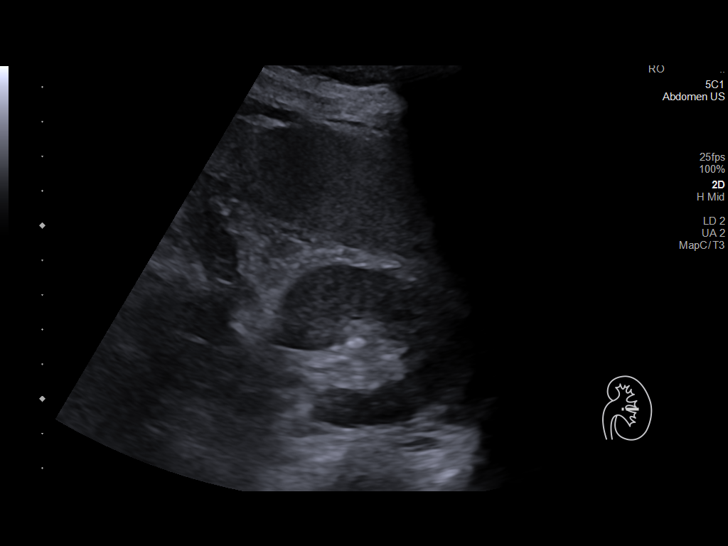
[im 136/136]
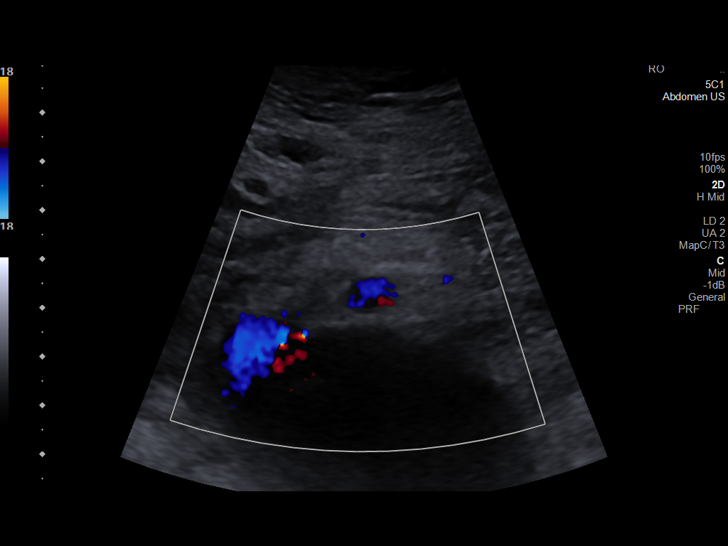

[13 of 25 positions shown; findings below may reference images not displayed]

FINDINGS: Gallbladder: A mild amount of echogenic gallbladder sludge is seen
within the dependent portion of the gallbladder lumen. No gallstones
or wall thickening visualized (2.3 mm). No sonographic Murphy sign
noted by sonographer.

Common bile duct: Diameter: 2.7 mm

Liver: No focal lesion identified. Diffusely increased echogenicity
of the liver parenchyma is noted. Portal vein is patent on color
Doppler imaging with normal direction of blood flow towards the
liver.

IVC: No abnormality visualized.

Pancreas: Poorly visualized secondary to overlying bowel gas.

Spleen: The spleen measures 14.0 cm in length. A 5.2 cm x 4.6 cm x
5.1 cm cystic appearing area is seen within the superior aspect of
the spleen. No abnormal flow is noted within this region on color
Doppler evaluation.

Right Kidney: Length: 13.0 cm. Echogenicity within normal limits. No
mass or hydronephrosis visualized.

Left Kidney: Length: 13.0 cm. Echogenicity within normal limits. No
mass or hydronephrosis visualized.

Abdominal aorta: No aneurysm visualized (2.8 cm).

Other findings: None.
IMPRESSION: 1. Gallbladder sludge without evidence of cholelithiasis or acute
cholecystitis.
2. Hepatic steatosis without focal liver lesions.
3. Large simple splenic cyst.

## 2022-09-08 ENCOUNTER — Other Ambulatory Visit: Payer: Self-pay | Admitting: Family

## 2022-09-08 DIAGNOSIS — Z1231 Encounter for screening mammogram for malignant neoplasm of breast: Secondary | ICD-10-CM

## 2022-09-11 ENCOUNTER — Other Ambulatory Visit: Payer: Self-pay | Admitting: Family

## 2022-09-11 MED ORDER — POTASSIUM CHLORIDE 20 MEQ PO PACK: 20.0000 meq | PACK | Freq: Every day | ORAL | 0 refills | Status: DC

## 2022-09-16 ENCOUNTER — Ambulatory Visit
Admission: RE | Admit: 2022-09-16 | Discharge: 2022-09-16 | Disposition: A | Discharge status: Home / Self Care | Payer: BC Managed Care – PPO | Source: Ambulatory Visit | Attending: Family | Admitting: Family

## 2022-09-16 DIAGNOSIS — Z1231 Encounter for screening mammogram for malignant neoplasm of breast: Secondary | ICD-10-CM | POA: Diagnosis not present

## 2022-10-05 ENCOUNTER — Other Ambulatory Visit: Payer: Self-pay | Admitting: Family

## 2022-10-29 ENCOUNTER — Other Ambulatory Visit: Payer: Self-pay | Admitting: Family

## 2022-10-29 DIAGNOSIS — M797 Fibromyalgia: Secondary | ICD-10-CM

## 2022-11-16 ENCOUNTER — Other Ambulatory Visit: Payer: Self-pay | Admitting: Family Medicine

## 2022-11-16 DIAGNOSIS — K219 Gastro-esophageal reflux disease without esophagitis: Secondary | ICD-10-CM

## 2023-02-09 ENCOUNTER — Other Ambulatory Visit: Payer: Self-pay | Admitting: Family

## 2023-02-09 DIAGNOSIS — I10 Essential (primary) hypertension: Secondary | ICD-10-CM

## 2023-02-28 ENCOUNTER — Other Ambulatory Visit: Payer: Self-pay | Admitting: Family

## 2023-02-28 DIAGNOSIS — I10 Essential (primary) hypertension: Secondary | ICD-10-CM

## 2023-03-31 ENCOUNTER — Other Ambulatory Visit: Payer: Self-pay | Admitting: Family

## 2023-03-31 DIAGNOSIS — I10 Essential (primary) hypertension: Secondary | ICD-10-CM

## 2023-03-31 NOTE — Telephone Encounter (Signed)
 Hawks pt NTBS 30-d given 03/01/23

## 2023-04-01 ENCOUNTER — Encounter: Payer: Self-pay | Admitting: Family

## 2023-04-01 NOTE — Telephone Encounter (Signed)
LMTCB to schedule appt Letter mailed 

## 2023-04-02 ENCOUNTER — Telehealth: Payer: Self-pay | Admitting: Family Medicine

## 2023-04-02 NOTE — Telephone Encounter (Signed)
Copied from CRM [id number]. Topic: Clinical - Medication Question >> Apr 02, 2023  2:04 PM Alcus Dad H wrote: Reason for CRM: pt called in regards to prescription refill for the atenolol (TENORMIN) 50 MG tablet, she wants to let Neysa Bonito know she made an apt in January and wants to know if the prescription can be refilled now

## 2023-04-05 ENCOUNTER — Telehealth: Payer: Self-pay | Admitting: Family Medicine

## 2023-04-05 ENCOUNTER — Other Ambulatory Visit: Payer: Self-pay | Admitting: Family

## 2023-04-05 DIAGNOSIS — I10 Essential (primary) hypertension: Secondary | ICD-10-CM

## 2023-04-05 NOTE — Telephone Encounter (Signed)
Copied from CRM [id number]. Topic: Clinical - Prescription Issue >> Apr 05, 2023  1:56 PM Clayton Bibles wrote: Reason for CRM: She said pharmacy did not have the refill authorization for atenolol (TENORMIN) 50 MG tablet. She does have an appointment for January. She only has 1 pill left. Please send her a message on My Chart. Thanks

## 2023-05-02 ENCOUNTER — Other Ambulatory Visit: Payer: Self-pay | Admitting: Family

## 2023-05-02 DIAGNOSIS — M797 Fibromyalgia: Secondary | ICD-10-CM

## 2023-05-02 DIAGNOSIS — I10 Essential (primary) hypertension: Secondary | ICD-10-CM

## 2023-05-06 ENCOUNTER — Ambulatory Visit (INDEPENDENT_AMBULATORY_CARE_PROVIDER_SITE_OTHER): Payer: BC Managed Care – PPO | Admitting: Family

## 2023-05-06 ENCOUNTER — Encounter: Payer: Self-pay | Admitting: Family

## 2023-05-06 VITALS — BP 128/76 | HR 73 | Temp 97.5°F | Ht 68.0 in | Wt 169.0 lb

## 2023-05-06 DIAGNOSIS — I10 Essential (primary) hypertension: Secondary | ICD-10-CM

## 2023-05-06 DIAGNOSIS — Z0000 Encounter for general adult medical examination without abnormal findings: Secondary | ICD-10-CM

## 2023-05-06 DIAGNOSIS — R7303 Prediabetes: Secondary | ICD-10-CM

## 2023-05-06 DIAGNOSIS — K219 Gastro-esophageal reflux disease without esophagitis: Secondary | ICD-10-CM

## 2023-05-06 DIAGNOSIS — E785 Hyperlipidemia, unspecified: Secondary | ICD-10-CM | POA: Diagnosis not present

## 2023-05-06 DIAGNOSIS — M797 Fibromyalgia: Secondary | ICD-10-CM | POA: Diagnosis not present

## 2023-05-06 DIAGNOSIS — R918 Other nonspecific abnormal finding of lung field: Secondary | ICD-10-CM

## 2023-05-06 DIAGNOSIS — Z0001 Encounter for general adult medical examination with abnormal findings: Secondary | ICD-10-CM | POA: Diagnosis not present

## 2023-05-06 DIAGNOSIS — E559 Vitamin D deficiency, unspecified: Secondary | ICD-10-CM | POA: Diagnosis not present

## 2023-05-06 DIAGNOSIS — M19011 Primary osteoarthritis, right shoulder: Secondary | ICD-10-CM

## 2023-05-06 LAB — BAYER DCA HB A1C WAIVED: HB A1C (BAYER DCA - WAIVED): 5.4 % (ref 4.8–5.6)

## 2023-05-06 MED ORDER — AMITRIPTYLINE HCL 150 MG PO TABS: 150.0000 mg | ORAL_TABLET | Freq: Every day | ORAL | 4 refills | Status: DC

## 2023-05-06 NOTE — Patient Instructions (Signed)
Fatty Liver Disease  The liver converts food into energy, removes toxic material from the blood, makes important proteins, and absorbs necessary vitamins from food. Fatty liver disease occurs when too much fat has built up in your liver cells. Fatty liver disease is also called hepatic steatosis. In many cases, fatty liver disease does not cause symptoms or problems. It is often diagnosed when tests are being done for other reasons. However, over time, fatty liver can cause inflammation that may lead to more serious liver problems, such as scarring of the liver (cirrhosis) and liver failure. Fatty liver is associated with insulin resistance, increased body fat, high blood pressure (hypertension), and high cholesterol. These are features of metabolic syndrome and increase your risk for stroke, diabetes, and heart disease. What are the causes? This condition may be caused by components of metabolic syndrome: Obesity. Insulin resistance. High cholesterol. Other causes: Alcohol abuse. Poor nutrition. Cushing syndrome. Pregnancy. Certain drugs. Poisons. Some viral infections. What increases the risk? You are more likely to develop this condition if you: Abuse alcohol. Are overweight. Have diabetes. Have hepatitis. Have a high triglyceride level. Are pregnant. What are the signs or symptoms? Fatty liver disease often does not cause symptoms. If symptoms do develop, they can include: Fatigue and weakness. Weight loss. Confusion. Nausea, vomiting, or abdominal pain. Yellowing of your skin and the white parts of your eyes (jaundice). Itchy skin. How is this diagnosed? This condition may be diagnosed by: A physical exam and your medical history. Blood tests. Imaging tests, such as an ultrasound, CT scan, or MRI. A liver biopsy. A small sample of liver tissue is removed using a needle. The sample is then looked at under a microscope. How is this treated? Fatty liver disease is often  caused by other health conditions. Treatment for fatty liver may involve medicines and lifestyle changes to manage conditions such as: Alcoholism. High cholesterol. Diabetes. Being overweight or obese. Follow these instructions at home:  Do not drink alcohol. If you have trouble quitting, ask your health care provider how to safely quit with the help of medicine or a supervised program. This is important to keep your condition from getting worse. Eat a healthy diet as told by your health care provider. Ask your health care provider about working with a dietitian to develop an eating plan. Exercise regularly. This can help you lose weight and control your cholesterol and diabetes. Talk to your health care provider about an exercise plan and which activities are best for you. Take over-the-counter and prescription medicines only as told by your health care provider. Keep all follow-up visits. This is important. Contact a health care provider if: You have trouble controlling your: Blood sugar. This is especially important if you have diabetes. Cholesterol. Drinking of alcohol. Get help right away if: You have abdominal pain. You have jaundice. You have nausea and are vomiting. You vomit blood or material that looks like coffee grounds. You have stools that are black, tar-like, or bloody. Summary Fatty liver disease develops when too much fat builds up in the cells of your liver. Fatty liver disease often causes no symptoms or problems. However, over time, fatty liver can cause inflammation that may lead to more serious liver problems, such as scarring of the liver (cirrhosis). You are more likely to develop this condition if you abuse alcohol, are pregnant, are overweight, have diabetes, have hepatitis, or have high triglyceride or cholesterol levels. Contact your health care provider if you have trouble controlling your blood   sugar, cholesterol, or drinking of alcohol. This information is  not intended to replace advice given to you by your health care provider. Make sure you discuss any questions you have with your health care provider. Document Revised: 01/18/2020 Document Reviewed: 01/18/2020 Elsevier Patient Education  2024 Elsevier Inc.  

## 2023-05-06 NOTE — Progress Notes (Signed)
Subjective:    Patient ID: April Kane, female    DOB: 1960-04-26, 63 y.o.   MRN: [id number]  Chief Complaint  Patient presents with   Medical Management of Chronic Issues    Discuss amitriptyline    Pt presents to the office today for CPE.    Pt has prediabetes and tries to be on low carb.   She has fibromyalgia and has been taking amitriptyline 100 mg that has helped her. Reports having increase pain and increase anxiety. Reports her pain can be 8 out 10.   She had a Ct chest on 08/30/22 that showed, "1. Calcified granuloma in the right upper lobe corresponding to x-ray findings. 2. Additional scattered sub 2 mm nodular densities throughout the right upper lobe. No follow-up needed if patient is low-risk (and has no known or suspected primary neoplasm). Non-contrast chest CT can be considered in 12 months if patient is high-risk. This recommendation follows the consensus statement: Guidelines for Management of Incidental Pulmonary Nodules Detected on CT Images: From the Fleischner Society 2017; Radiology 2017; 284:228-243. 3. Fatty infiltration of the liver. 4. Mild splenomegaly. 5. 5 cm hypodense area in the anterior spleen may represent a cyst or mildly complex cyst."  Reports her father and grandfather had lung cancer. She has hx of smoking, but quit 2019. Hypertension This is a chronic problem. The current episode started more than 1 year ago. The problem has been resolved since onset. The problem is controlled. Associated symptoms include malaise/fatigue. Pertinent negatives include no peripheral edema or shortness of breath. Risk factors for coronary artery disease include dyslipidemia and obesity. The current treatment provides moderate improvement.  Gastroesophageal Reflux She complains of belching and heartburn. This is a chronic problem. The current episode started more than 1 year ago. The problem occurs occasionally. The symptoms are aggravated by certain foods. Risk  factors include obesity. She has tried a PPI for the symptoms. The treatment provided moderate relief.  Arthritis Presents for follow-up visit. She complains of pain and stiffness. The symptoms have been stable. Affected locations include the right knee, left knee and right shoulder (back). Her pain is at a severity of 3/10.  Hyperlipidemia This is a chronic problem. The current episode started more than 1 year ago. The problem is uncontrolled. Recent lipid tests were reviewed and are high. Pertinent negatives include no shortness of breath. Current antihyperlipidemic treatment includes diet change. The current treatment provides mild improvement of lipids. Risk factors for coronary artery disease include dyslipidemia, hypertension, a sedentary lifestyle and post-menopausal.      Review of Systems  Constitutional:  Positive for malaise/fatigue.  Respiratory:  Negative for shortness of breath.   Gastrointestinal:  Positive for heartburn.  Musculoskeletal:  Positive for stiffness.  All other systems reviewed and are negative.      Family History  Problem Relation Age of Onset   Hypertension Mother    Cancer Father        lung   Breast cancer Maternal Grandmother    Social History   Socioeconomic History   Marital status: Divorced    Spouse name: Not on file   Number of children: Not on file   Years of education: Not on file   Highest education level: Some college, no degree  Occupational History   Not on file  Tobacco Use   Smoking status: Former    Current packs/day: 0.00    Types: Cigarettes    Quit date: 08/05/2016    Years since quitting:  6.7   Smokeless tobacco: Never  Vaping Use   Vaping status: Never Used  Substance and Sexual Activity   Alcohol use: No    Alcohol/week: 0.0 standard drinks of alcohol   Drug use: No   Sexual activity: Not on file  Other Topics Concern   Not on file  Social History Narrative   Not on file   Social Drivers of Health    Financial Resource Strain: Low Risk  (05/02/2023)   Overall Financial Resource Strain (CARDIA)    Difficulty of Paying Living Expenses: Not very hard  Food Insecurity: No Food Insecurity (05/02/2023)   Hunger Vital Sign    Worried About Running Out of Food in the Last Year: Never true    Ran Out of Food in the Last Year: Never true  Transportation Needs: No Transportation Needs (05/02/2023)   PRAPARE - Administrator, Civil Service (Medical): No    Lack of Transportation (Non-Medical): No  Physical Activity: Unknown (05/02/2023)   Exercise Vital Sign    Days of Exercise per Week: 5 days    Minutes of Exercise per Session: Not on file  Stress: No Stress Concern Present (05/02/2023)   [employer] of Occupational Health - Occupational Stress Questionnaire    Feeling of Stress : Not at all  Social Connections: Socially Isolated (05/02/2023)   Social Connection and Isolation Panel [NHANES]    Frequency of Communication with Friends and Family: Twice a week    Frequency of Social Gatherings with Friends and Family: Once a week    Attends Religious Services: Never    Database administrator or Organizations: No    Attends Engineer, structural: Not on file    Marital Status: Divorced    Objective:   Physical Exam Vitals reviewed.  Constitutional:      General: She is not in acute distress.    Appearance: She is well-developed.  HENT:     Head: Normocephalic and atraumatic.     Right Ear: Tympanic membrane normal.     Left Ear: Tympanic membrane normal.  Eyes:     Pupils: Pupils are equal, round, and reactive to light.  Neck:     Thyroid: No thyromegaly.  Cardiovascular:     Rate and Rhythm: Normal rate and regular rhythm.     Heart sounds: Normal heart sounds. No murmur heard. Pulmonary:     Effort: Pulmonary effort is normal. No respiratory distress.     Breath sounds: Normal breath sounds. No wheezing.  Abdominal:     General: Bowel sounds are normal.  There is no distension.     Palpations: Abdomen is soft.     Tenderness: There is no abdominal tenderness.  Musculoskeletal:        General: No tenderness. Normal range of motion.     Cervical back: Normal range of motion and neck supple.  Skin:    General: Skin is warm and dry.  Neurological:     Mental Status: She is alert and oriented to person, place, and time.     Cranial Nerves: No cranial nerve deficit.     Deep Tendon Reflexes: Reflexes are normal and symmetric.  Psychiatric:        Behavior: Behavior normal.        Thought Content: Thought content normal.        Judgment: Judgment normal.       BP 128/76   Pulse 73   Temp (!) 97.5 F (36.4  C) (Temporal)   Ht 5\' 8"  (1.727 m)   Wt 169 lb (76.7 kg)   LMP 04/20/2013   SpO2 93%   BMI 25.70 kg/m      Assessment & Plan:  Keita Nam comes in today with chief complaint of Medical Management of Chronic Issues (Discuss amitriptyline )   Diagnosis and orders addressed:  1. Annual physical exam (Primary) - CMP14+EGFR - CBC with Differential/Platelet - Lipid panel - TSH - VITAMIN D 25 Hydroxy (Vit-D Deficiency, Fractures)  2. Essential hypertension - CMP14+EGFR - CBC with Differential/Platelet  3. Fibromyalgia - CMP14+EGFR - CBC with Differential/Platelet - amitriptyline (ELAVIL) 150 MG tablet; Take 1 tablet (150 mg total) by mouth at bedtime.  Dispense: 90 tablet; Refill: 4  4. Hyperlipidemia, unspecified hyperlipidemia type - CMP14+EGFR - CBC with Differential/Platelet  5. Prediabetes - CMP14+EGFR - CBC with Differential/Platelet - Bayer DCA Hb A1c Waived  6. Primary osteoarthritis of right shoulder - CMP14+EGFR - CBC with Differential/Platelet  7. Gastroesophageal reflux disease, unspecified whether esophagitis present - CMP14+EGFR - CBC with Differential/Platelet  8. Lung nodules - CT Chest Wo Contrast; Future - CMP14+EGFR - CBC with Differential/Platelet  9. Vitamin D deficiency -  CMP14+EGFR - CBC with Differential/Platelet - VITAMIN D 25 Hydroxy (Vit-D Deficiency, Fractures)   Will increase amitriptyline to 150 mg from 100 mg Labs pending Continue current medications  Repeat CT ordered for future 08/30/23 Health Maintenance reviewed Diet and exercise encouraged  Follow up plan: 1 year    Jannifer Rodney, FNP

## 2023-05-07 ENCOUNTER — Other Ambulatory Visit: Payer: Self-pay | Admitting: Family

## 2023-05-07 ENCOUNTER — Other Ambulatory Visit: Payer: Self-pay | Admitting: Family Medicine

## 2023-05-07 LAB — CMP14+EGFR
ALT: 52 [IU]/L — ABNORMAL HIGH (ref 0–32)
AST: 51 [IU]/L — ABNORMAL HIGH (ref 0–40)
Albumin: 4.2 g/dL (ref 3.8–4.9)
Alkaline Phosphatase: 97 [IU]/L (ref 44–121)
BUN/Creatinine Ratio: 9 — ABNORMAL LOW (ref 12–28)
BUN: 7 mg/dL — ABNORMAL LOW (ref 8–27)
Bilirubin Total: 0.5 mg/dL (ref 0.0–1.2)
CO2: 24 mmol/L (ref 20–29)
Calcium: 9.1 mg/dL (ref 8.7–10.3)
Chloride: 98 mmol/L (ref 96–106)
Creatinine, Ser: 0.81 mg/dL (ref 0.57–1.00)
Globulin, Total: 2.4 g/dL (ref 1.5–4.5)
Glucose: 148 mg/dL — ABNORMAL HIGH (ref 70–99)
Potassium: 3.3 mmol/L — ABNORMAL LOW (ref 3.5–5.2)
Sodium: 139 mmol/L (ref 134–144)
Total Protein: 6.6 g/dL (ref 6.0–8.5)
eGFR: 83 mL/min/{1.73_m2} (ref >59)

## 2023-05-07 LAB — CBC WITH DIFFERENTIAL/PLATELET
Basophils Absolute: 0 10*3/uL (ref 0.0–0.2)
Basos: 0 %
EOS (ABSOLUTE): 0.3 10*3/uL (ref 0.0–0.4)
Eos: 6 %
Hematocrit: 44.1 % (ref 34.0–46.6)
Hemoglobin: 14.1 g/dL (ref 11.1–15.9)
Immature Grans (Abs): 0 10*3/uL (ref 0.0–0.1)
Immature Granulocytes: 0 %
Lymphocytes Absolute: 1.6 10*3/uL (ref 0.7–3.1)
Lymphs: 28 %
MCH: 27.6 pg (ref 26.6–33.0)
MCHC: 32 g/dL (ref 31.5–35.7)
MCV: 87 fL (ref 79–97)
Monocytes Absolute: 0.3 10*3/uL (ref 0.1–0.9)
Monocytes: 5 %
Neutrophils Absolute: 3.5 10*3/uL (ref 1.4–7.0)
Neutrophils: 61 %
Platelets: 276 10*3/uL (ref 150–450)
RBC: 5.1 x10E6/uL (ref 3.77–5.28)
RDW: 12.9 % (ref 11.7–15.4)
WBC: 5.8 10*3/uL (ref 3.4–10.8)

## 2023-05-07 LAB — VITAMIN D 25 HYDROXY (VIT D DEFICIENCY, FRACTURES): Vit D, 25-Hydroxy: 42.4 ng/mL (ref 30.0–100.0)

## 2023-05-07 LAB — LIPID PANEL
Chol/HDL Ratio: 5.3 {ratio} — ABNORMAL HIGH (ref 0.0–4.4)
Cholesterol, Total: 206 mg/dL — ABNORMAL HIGH (ref 100–199)
HDL: 39 mg/dL — ABNORMAL LOW (ref >39)
LDL Chol Calc (NIH): 132 mg/dL — ABNORMAL HIGH (ref 0–99)
Triglycerides: 198 mg/dL — ABNORMAL HIGH (ref 0–149)
VLDL Cholesterol Cal: 35 mg/dL (ref 5–40)

## 2023-05-07 LAB — TSH: TSH: 3.45 u[IU]/mL (ref 0.450–4.500)

## 2023-05-07 MED ORDER — LISINOPRIL 20 MG PO TABS: 20.0000 mg | ORAL_TABLET | Freq: Every day | ORAL | 3 refills | Status: DC

## 2023-05-07 MED ORDER — POTASSIUM CHLORIDE 20 MEQ PO PACK: 20.0000 meq | PACK | Freq: Every day | ORAL | 0 refills | Status: DC

## 2023-05-16 ENCOUNTER — Other Ambulatory Visit: Payer: Self-pay | Admitting: Family

## 2023-05-16 DIAGNOSIS — K219 Gastro-esophageal reflux disease without esophagitis: Secondary | ICD-10-CM

## 2023-06-07 ENCOUNTER — Ambulatory Visit: Payer: BC Managed Care – PPO | Admitting: Family

## 2023-06-22 ENCOUNTER — Other Ambulatory Visit: Payer: Self-pay | Admitting: Family

## 2023-06-23 ENCOUNTER — Other Ambulatory Visit: Payer: Self-pay | Admitting: Family

## 2023-06-24 ENCOUNTER — Telehealth: Payer: BC Managed Care – PPO | Admitting: Family

## 2023-06-24 ENCOUNTER — Encounter: Payer: Self-pay | Admitting: Family

## 2023-06-24 DIAGNOSIS — I10 Essential (primary) hypertension: Secondary | ICD-10-CM | POA: Diagnosis not present

## 2023-06-24 DIAGNOSIS — M797 Fibromyalgia: Secondary | ICD-10-CM

## 2023-06-24 DIAGNOSIS — E876 Hypokalemia: Secondary | ICD-10-CM | POA: Diagnosis not present

## 2023-06-24 MED ORDER — LOSARTAN POTASSIUM 50 MG PO TABS
50.0000 mg | ORAL_TABLET | Freq: Every day | ORAL | 2 refills | Status: DC
Start: 2023-06-24 — End: 2023-07-27

## 2023-06-24 NOTE — Progress Notes (Signed)
 Virtual Visit Consent   April Kane, you are scheduled for a virtual visit with a [facility] provider today. Just as with appointments in the office, your consent must be obtained to participate. Your consent will be active for this visit and any virtual visit you may have with one of our providers in the next 365 days. If you have a MyChart account, a copy of this consent can be sent to you electronically.  As this is a virtual visit, video technology does not allow for your provider to perform a traditional examination. This may limit your provider's ability to fully assess your condition. If your provider identifies any concerns that need to be evaluated in person or the need to arrange testing (such as labs, EKG, etc.), we will make arrangements to do so. Although advances in technology are sophisticated, we cannot ensure that it will always work on either your end or our end. If the connection with a video visit is poor, the visit may have to be switched to a telephone visit. With either a video or telephone visit, we are not always able to ensure that we have a secure connection.  By engaging in this virtual visit, you consent to the provision of healthcare and authorize for your insurance to be billed (if applicable) for the services provided during this visit. Depending on your insurance coverage, you may receive a charge related to this service.  I need to obtain your verbal consent now. Are you willing to proceed with your visit today? April Kane has provided verbal consent on 06/24/2023 for a virtual visit (video or telephone). Jannifer Rodney, FNP  Date: 06/24/2023 11:17 AM   Virtual Visit via Video Note   I, Jannifer Rodney, connected with  April Kane  ([id number], 1961/01/05) on 06/24/23 at 11:25 AM EST by a video-enabled telemedicine application and verified that I am speaking with the correct person using two identifiers.  Location: Patient: Virtual Visit Location Patient:  Home Provider: Virtual Visit Location Provider: Home Office   I discussed the limitations of evaluation and management by telemedicine and the availability of in person appointments. The patient expressed understanding and agreed to proceed.    History of Present Illness: April Kane is a 63 y.o. who identifies as a female who was assigned female at birth, and is being seen today for to recheck her BP and potassium. She was seen 05/06/23 and her potassium has been low. We stopped her hydrochlorothiazide 25 mg and started lisinopril 20 mg. She reports a dry cough since starting the lisinopril 20 mg.   Her last visit we also increased her amitriptyline to 150 mg from 100 mg for her Fibromyalgia. Reports this has helped her anxiety and general pain.  HPI: Hypertension This is a chronic problem. The current episode started more than 1 year ago. The problem has been resolved since onset. The problem is controlled. Pertinent negatives include no malaise/fatigue, peripheral edema or shortness of breath. Past treatments include ACE inhibitors. The current treatment provides moderate improvement.    Problems:  Patient Active Problem List   Diagnosis Date Noted   Primary osteoarthritis of right shoulder 06/23/2021   Prediabetes 03/28/2019   GERD (gastroesophageal reflux disease) 01/15/2016   Hyperlipidemia 06/14/2015   Vitamin D deficiency 06/14/2015   Fibromyalgia 06/13/2015   Essential hypertension 06/07/2014    Allergies: No Known Allergies Medications:  Current Outpatient Medications:    losartan (COZAAR) 50 MG tablet, Take 1 tablet (50 mg total) by mouth daily., Disp:  90 tablet, Rfl: 2   amitriptyline (ELAVIL) 150 MG tablet, Take 1 tablet (150 mg total) by mouth at bedtime., Disp: 90 tablet, Rfl: 4   atenolol (TENORMIN) 50 MG tablet, Take 1 tablet by mouth once daily, Disp: 90 tablet, Rfl: 0   fluticasone (FLONASE) 50 MCG/ACT nasal spray, Place 2 sprays into both nostrils daily. (NEEDS TO  BE SEEN BEFORE NEXT REFILL), Disp: 16 g, Rfl: 0   omeprazole (PRILOSEC) 20 MG capsule, TAKE 1 CAPSULE BY MOUTH TWICE DAILY BEFORE A MEAL, Disp: 180 capsule, Rfl: 0   potassium chloride (KLOR-CON) 20 MEQ packet, Take 20 mEq by mouth daily., Disp: 50 each, Rfl: 0   Vitamin D, Ergocalciferol, (DRISDOL) 1.25 MG (50000 UNIT) CAPS capsule, Take 1 capsule by mouth once a week, Disp: 12 capsule, Rfl: 0  Observations/Objective: Patient is well-developed, well-nourished in no acute distress.  Resting comfortably  at home.  Head is normocephalic, atraumatic.  No labored breathing.  Speech is clear and coherent with logical content.  Patient is alert and oriented at baseline.    Assessment and Plan: 1. Essential hypertension (Primary) - losartan (COZAAR) 50 MG tablet; Take 1 tablet (50 mg total) by mouth daily.  Dispense: 90 tablet; Refill: 2 - BMP8+EGFR; Future  2. Hypokalemia - BMP8+EGFR; Future  3. Fibromyalgia  Will change Lisinopril 20 mg to Losartan 50 mg, because of dry cough Dash diet information given -Exercise encouraged - Stress Management  -Continue current meds Will come in for BMP to recheck kidney function after starting ACE and K+. IF K+ is normal can stop oral K+ Follow up in 4 weeks for BP recheck   Follow Up Instructions: I discussed the assessment and treatment plan with the patient. The patient was provided an opportunity to ask questions and all were answered. The patient agreed with the plan and demonstrated an understanding of the instructions.  A copy of instructions were sent to the patient via MyChart unless otherwise noted below.     The patient was advised to call back or seek an in-person evaluation if the symptoms worsen or if the condition fails to improve as anticipated.    Jannifer Rodney, FNP

## 2023-06-25 ENCOUNTER — Other Ambulatory Visit

## 2023-06-25 DIAGNOSIS — E876 Hypokalemia: Secondary | ICD-10-CM | POA: Diagnosis not present

## 2023-06-25 DIAGNOSIS — I10 Essential (primary) hypertension: Secondary | ICD-10-CM

## 2023-06-26 LAB — BMP8+EGFR
BUN/Creatinine Ratio: 8 — ABNORMAL LOW (ref 12–28)
BUN: 8 mg/dL (ref 8–27)
CO2: 26 mmol/L (ref 20–29)
Calcium: 9.4 mg/dL (ref 8.7–10.3)
Chloride: 103 mmol/L (ref 96–106)
Creatinine, Ser: 0.98 mg/dL (ref 0.57–1.00)
Glucose: 114 mg/dL — ABNORMAL HIGH (ref 70–99)
Potassium: 3.9 mmol/L (ref 3.5–5.2)
Sodium: 142 mmol/L (ref 134–144)
eGFR: 66 mL/min/{1.73_m2} (ref >59)

## 2023-06-28 ENCOUNTER — Telehealth: Payer: Self-pay | Admitting: Family Medicine

## 2023-06-28 NOTE — Telephone Encounter (Signed)
 Patient aware and verbalized understanding.

## 2023-06-28 NOTE — Telephone Encounter (Signed)
 Copied from CRM [id number]. Topic: Clinical - Medication Question >> Jun 28, 2023  1:52 PM Clayton Bibles wrote: Reason for CRM:  Message for Diamantina Monks, LPN: I read her this from chart: Kidney  function stable; Potassium normal- Can stop oral potassium.   Celes has a question. Does she continue taking Vitamin D, Ergocalciferol, (DRISDOL) and only stop the oral potassium?  Please call from now to 3:15 PM at [phone]. Thanks

## 2023-07-04 ENCOUNTER — Other Ambulatory Visit: Payer: Self-pay | Admitting: Family

## 2023-07-04 DIAGNOSIS — J0110 Acute frontal sinusitis, unspecified: Secondary | ICD-10-CM

## 2023-07-23 ENCOUNTER — Encounter: Payer: Self-pay | Admitting: Family

## 2023-07-23 ENCOUNTER — Ambulatory Visit: Admitting: Family

## 2023-07-23 VITALS — BP 137/88 | HR 74 | Temp 97.5°F | Ht 68.0 in | Wt 167.0 lb

## 2023-07-23 DIAGNOSIS — K219 Gastro-esophageal reflux disease without esophagitis: Secondary | ICD-10-CM | POA: Diagnosis not present

## 2023-07-23 DIAGNOSIS — E785 Hyperlipidemia, unspecified: Secondary | ICD-10-CM | POA: Diagnosis not present

## 2023-07-23 DIAGNOSIS — I10 Essential (primary) hypertension: Secondary | ICD-10-CM

## 2023-07-23 MED ORDER — OMEPRAZOLE 20 MG PO CPDR: 20.0000 mg | DELAYED_RELEASE_CAPSULE | Freq: Every day | ORAL | 2 refills | Status: DC

## 2023-07-23 MED ORDER — ATENOLOL 50 MG PO TABS: 50.0000 mg | ORAL_TABLET | Freq: Every day | ORAL | 0 refills | Status: DC

## 2023-07-23 MED ORDER — FLUTICASONE PROPIONATE 50 MCG/ACT NA SUSP: 2.0000 | Freq: Every day | NASAL | 2 refills | Status: DC

## 2023-07-23 NOTE — Patient Instructions (Signed)
 Hypertension, Adult High blood pressure (hypertension) is when the force of blood pumping through the arteries is too strong. The arteries are the blood vessels that carry blood from the heart throughout the body. Hypertension forces the heart to work harder to pump blood and may cause arteries to become narrow or stiff. Untreated or uncontrolled hypertension can lead to a heart attack, heart failure, a stroke, kidney disease, and other problems. A blood pressure reading consists of a higher number over a lower number. Ideally, your blood pressure should be below 120/80. The first ("top") number is called the systolic pressure. It is a measure of the pressure in your arteries as your heart beats. The second ("bottom") number is called the diastolic pressure. It is a measure of the pressure in your arteries as the heart relaxes. What are the causes? The exact cause of this condition is not known. There are some conditions that result in high blood pressure. What increases the risk? Certain factors may make you more likely to develop high blood pressure. Some of these risk factors are under your control, including: Smoking. Not getting enough exercise or physical activity. Being overweight. Having too much fat, sugar, calories, or salt (sodium) in your diet. Drinking too much alcohol. Other risk factors include: Having a personal history of heart disease, diabetes, high cholesterol, or kidney disease. Stress. Having a family history of high blood pressure and high cholesterol. Having obstructive sleep apnea. Age. The risk increases with age. What are the signs or symptoms? High blood pressure may not cause symptoms. Very high blood pressure (hypertensive crisis) may cause: Headache. Fast or irregular heartbeats (palpitations). Shortness of breath. Nosebleed. Nausea and vomiting. Vision changes. Severe chest pain, dizziness, and seizures. How is this diagnosed? This condition is diagnosed by  measuring your blood pressure while you are seated, with your arm resting on a flat surface, your legs uncrossed, and your feet flat on the floor. The cuff of the blood pressure monitor will be placed directly against the skin of your upper arm at the level of your heart. Blood pressure should be measured at least twice using the same arm. Certain conditions can cause a difference in blood pressure between your right and left arms. If you have a high blood pressure reading during one visit or you have normal blood pressure with other risk factors, you may be asked to: Return on a different day to have your blood pressure checked again. Monitor your blood pressure at home for 1 week or longer. If you are diagnosed with hypertension, you may have other blood or imaging tests to help your health care provider understand your overall risk for other conditions. How is this treated? This condition is treated by making healthy lifestyle changes, such as eating healthy foods, exercising more, and reducing your alcohol intake. You may be referred for counseling on a healthy diet and physical activity. Your health care provider may prescribe medicine if lifestyle changes are not enough to get your blood pressure under control and if: Your systolic blood pressure is above 130. Your diastolic blood pressure is above 80. Your personal target blood pressure may vary depending on your medical conditions, your age, and other factors. Follow these instructions at home: Eating and drinking  Eat a diet that is high in fiber and potassium, and low in sodium, added sugar, and fat. An example of this eating plan is called the DASH diet. DASH stands for Dietary Approaches to Stop Hypertension. To eat this way: Eat  plenty of fresh fruits and vegetables. Try to fill one half of your plate at each meal with fruits and vegetables. Eat whole grains, such as whole-wheat pasta, brown rice, or whole-grain bread. Fill about one  fourth of your plate with whole grains. Eat or drink low-fat dairy products, such as skim milk or low-fat yogurt. Avoid fatty cuts of meat, processed or cured meats, and poultry with skin. Fill about one fourth of your plate with lean proteins, such as fish, chicken without skin, beans, eggs, or tofu. Avoid pre-made and processed foods. These tend to be higher in sodium, added sugar, and fat. Reduce your daily sodium intake. Many people with hypertension should eat less than 1,500 mg of sodium a day. Do not drink alcohol if: Your health care provider tells you not to drink. You are pregnant, may be pregnant, or are planning to become pregnant. If you drink alcohol: Limit how much you have to: 0-1 drink a day for women. 0-2 drinks a day for men. Know how much alcohol is in your drink. In the [place] one drink equals one 12 oz bottle of beer (355 mL), one 5 oz glass of wine (148 mL), or one 1 oz glass of hard liquor (44 mL). Lifestyle  Work with your health care provider to maintain a healthy body weight or to lose weight. Ask what an ideal weight is for you. Get at least 30 minutes of exercise that causes your heart to beat faster (aerobic exercise) most days of the week. Activities may include walking, swimming, or biking. Include exercise to strengthen your muscles (resistance exercise), such as Pilates or lifting weights, as part of your weekly exercise routine. Try to do these types of exercises for 30 minutes at least 3 days a week. Do not use any products that contain nicotine or tobacco. These products include cigarettes, chewing tobacco, and vaping devices, such as e-cigarettes. If you need help quitting, ask your health care provider. Monitor your blood pressure at home as told by your health care provider. Keep all follow-up visits. This is important. Medicines Take over-the-counter and prescription medicines only as told by your health care provider. Follow directions carefully. Blood  pressure medicines must be taken as prescribed. Do not skip doses of blood pressure medicine. Doing this puts you at risk for problems and can make the medicine less effective. Ask your health care provider about side effects or reactions to medicines that you should watch for. Contact a health care provider if you: Think you are having a reaction to a medicine you are taking. Have headaches that keep coming back (recurring). Feel dizzy. Have swelling in your ankles. Have trouble with your vision. Get help right away if you: Develop a severe headache or confusion. Have unusual weakness or numbness. Feel faint. Have severe pain in your chest or abdomen. Vomit repeatedly. Have trouble breathing. These symptoms may be an emergency. Get help right away. Call 911. Do not wait to see if the symptoms will go away. Do not drive yourself to the hospital. Summary Hypertension is when the force of blood pumping through your arteries is too strong. If this condition is not controlled, it may put you at risk for serious complications. Your personal target blood pressure may vary depending on your medical conditions, your age, and other factors. For most people, a normal blood pressure is less than 120/80. Hypertension is treated with lifestyle changes, medicines, or a combination of both. Lifestyle changes include losing weight, eating a healthy,  low-sodium diet, exercising more, and limiting alcohol. This information is not intended to replace advice given to you by your health care provider. Make sure you discuss any questions you have with your health care provider. Document Revised: 02/11/2021 Document Reviewed: 02/11/2021 Elsevier Patient Education  2024 [employer].

## 2023-07-23 NOTE — Progress Notes (Signed)
 Subjective:    Patient ID: April Kane, female    DOB: Sep 23, 1960, 63 y.o.   MRN: [id number]  Chief Complaint  Patient presents with   Hypertension    4 week    PT presents to the office today to recheck HTN. We changed her lisinopril to Losartan because of a dry cough. Her BP is resolved and her dry cough resolved.  Hypertension This is a chronic problem. The current episode started more than 1 year ago. The problem has been resolved since onset. The problem is controlled. Pertinent negatives include no malaise/fatigue, peripheral edema or shortness of breath. Risk factors for coronary artery disease include obesity. The current treatment provides moderate improvement.  Gastroesophageal Reflux She complains of belching and heartburn. This is a chronic problem. The current episode started more than 1 year ago. The problem occurs occasionally. She has tried a PPI for the symptoms. The treatment provided moderate relief.  Hyperlipidemia This is a chronic problem. The current episode started more than 1 year ago. The problem is uncontrolled. Recent lipid tests were reviewed and are high. Pertinent negatives include no shortness of breath. Current antihyperlipidemic treatment includes diet change. The current treatment provides mild improvement of lipids. Risk factors for coronary artery disease include dyslipidemia, hypertension, a sedentary lifestyle and post-menopausal.      Review of Systems  Constitutional:  Negative for malaise/fatigue.  Respiratory:  Negative for shortness of breath.   Gastrointestinal:  Positive for heartburn.  All other systems reviewed and are negative.   Social History   Socioeconomic History   Marital status: Divorced    Spouse name: Not on file   Number of children: Not on file   Years of education: Not on file   Highest education level: Some college, no degree  Occupational History   Not on file  Tobacco Use   Smoking status: Former    Current  packs/day: 0.00    Types: Cigarettes    Quit date: 08/05/2016    Years since quitting: 6.9   Smokeless tobacco: Never  Vaping Use   Vaping status: Never Used  Substance and Sexual Activity   Alcohol use: No    Alcohol/week: 0.0 standard drinks of alcohol   Drug use: No   Sexual activity: Not on file  Other Topics Concern   Not on file  Social History Narrative   Not on file   Social Drivers of Health   Financial Resource Strain: Low Risk  (05/02/2023)   Overall Financial Resource Strain (CARDIA)    Difficulty of Paying Living Expenses: Not very hard  Food Insecurity: No Food Insecurity (05/02/2023)   Hunger Vital Sign    Worried About Running Out of Food in the Last Year: Never true    Ran Out of Food in the Last Year: Never true  Transportation Needs: No Transportation Needs (05/02/2023)   PRAPARE - Administrator, Civil Service (Medical): No    Lack of Transportation (Non-Medical): No  Physical Activity: Unknown (05/02/2023)   Exercise Vital Sign    Days of Exercise per Week: 5 days    Minutes of Exercise per Session: Not on file  Stress: No Stress Concern Present (05/02/2023)   [employer] of Occupational Health - Occupational Stress Questionnaire    Feeling of Stress : Not at all  Social Connections: Socially Isolated (05/02/2023)   Social Connection and Isolation Panel [NHANES]    Frequency of Communication with Friends and Family: Twice a week  Frequency of Social Gatherings with Friends and Family: Once a week    Attends Religious Services: Never    Database administrator or Organizations: No    Attends Engineer, structural: Not on file    Marital Status: Divorced   Family History  Problem Relation Age of Onset   Hypertension Mother    Cancer Father        lung   Breast cancer Maternal Grandmother         Objective:   Physical Exam Vitals reviewed.  Constitutional:      General: She is not in acute distress.    Appearance: She  is well-developed.  HENT:     Head: Normocephalic and atraumatic.     Right Ear: Tympanic membrane normal.     Left Ear: Tympanic membrane normal.  Eyes:     Pupils: Pupils are equal, round, and reactive to light.  Neck:     Thyroid: No thyromegaly.  Cardiovascular:     Rate and Rhythm: Normal rate and regular rhythm.     Heart sounds: Normal heart sounds. No murmur heard. Pulmonary:     Effort: Pulmonary effort is normal. No respiratory distress.     Breath sounds: Normal breath sounds. No wheezing.  Abdominal:     General: Bowel sounds are normal. There is no distension.     Palpations: Abdomen is soft.     Tenderness: There is no abdominal tenderness.  Musculoskeletal:        General: No tenderness. Normal range of motion.     Cervical back: Normal range of motion and neck supple.  Skin:    General: Skin is warm and dry.  Neurological:     Mental Status: She is alert and oriented to person, place, and time.     Cranial Nerves: No cranial nerve deficit.     Deep Tendon Reflexes: Reflexes are normal and symmetric.  Psychiatric:        Behavior: Behavior normal.        Thought Content: Thought content normal.        Judgment: Judgment normal.       BP 137/88   Pulse 74   Temp (!) 97.5 F (36.4 C) (Temporal)   Ht 5\' 8"  (1.727 m)   Wt 167 lb (75.8 kg)   LMP 04/20/2013   SpO2 93%   BMI 25.39 kg/m      Assessment & Plan:  [name] comes in today with chief complaint of Hypertension (4 week )   Diagnosis and orders addressed:  1. Essential hypertension -Daily blood pressure log given with instructions on how to fill out and told to bring to next visit -Dash diet information given -Exercise encouraged - Stress Management  -Continue current meds - atenolol (TENORMIN) 50 MG tablet; Take 1 tablet (50 mg total) by mouth daily.  Dispense: 90 tablet; Refill: 0 - CMP14+EGFR - CT CARDIAC SCORING (SELF PAY ONLY); Future  2. Gastroesophageal reflux disease,  unspecified whether esophagitis present - omeprazole (PRILOSEC) 20 MG capsule; Take 1 capsule (20 mg total) by mouth daily.  Dispense: 90 capsule; Refill: 2 - CMP14+EGFR  3. Hyperlipidemia, unspecified hyperlipidemia type (Primary) - CT CARDIAC SCORING (SELF PAY ONLY); Future   Labs pending Continue current medications  -Daily blood pressure log given with instructions on how to fill out and told to bring to next visit -Dash diet information given -Exercise encouraged - Stress Management    Return in about 6 months (around  01/22/2024), or if symptoms worsen or fail to improve.    Jannifer Rodney, FNP

## 2023-07-24 LAB — CMP14+EGFR
ALT: 40 IU/L — ABNORMAL HIGH (ref 0–32)
AST: 36 IU/L (ref 0–40)
Albumin: 4.1 g/dL (ref 3.8–4.9)
Alkaline Phosphatase: 99 IU/L (ref 44–121)
BUN/Creatinine Ratio: 9 — ABNORMAL LOW (ref 12–28)
BUN: 8 mg/dL (ref 8–27)
Bilirubin Total: 0.4 mg/dL (ref 0.0–1.2)
CO2: 24 mmol/L (ref 20–29)
Calcium: 8.9 mg/dL (ref 8.7–10.3)
Chloride: 103 mmol/L (ref 96–106)
Creatinine, Ser: 0.89 mg/dL (ref 0.57–1.00)
Globulin, Total: 2.4 g/dL (ref 1.5–4.5)
Glucose: 122 mg/dL — ABNORMAL HIGH (ref 70–99)
Potassium: 3.8 mmol/L (ref 3.5–5.2)
Sodium: 141 mmol/L (ref 134–144)
Total Protein: 6.5 g/dL (ref 6.0–8.5)
eGFR: 74 mL/min/{1.73_m2} (ref >59)

## 2023-07-27 ENCOUNTER — Telehealth: Payer: Self-pay

## 2023-07-27 MED ORDER — LOSARTAN POTASSIUM 100 MG PO TABS: 100.0000 mg | ORAL_TABLET | Freq: Every day | ORAL | 1 refills | Status: DC

## 2023-07-27 NOTE — Telephone Encounter (Signed)
Losartan 100 mg Prescription sent to pharmacy.  

## 2023-07-27 NOTE — Telephone Encounter (Signed)
 Copied from CRM [id number]. Topic: Clinical - Medication Question >> Jul 27, 2023  2:04 PM Dondra Prader E wrote: Reason for CRM: Pt called back requesting to go up on her blood pressure medication per discussion with PCP. Says she wants to go up to 100 MG of losartan [facility])   [facility] [address], [place] - [address] [place] [address] Phone: [phone] Fax: [phone]

## 2023-07-27 NOTE — Telephone Encounter (Signed)
 Left detailed message.

## 2023-08-01 ENCOUNTER — Other Ambulatory Visit: Payer: Self-pay | Admitting: Family

## 2023-08-01 DIAGNOSIS — I10 Essential (primary) hypertension: Secondary | ICD-10-CM

## 2023-08-13 ENCOUNTER — Ambulatory Visit (HOSPITAL_COMMUNITY)
Admission: RE | Admit: 2023-08-13 | Discharge: 2023-08-13 | Disposition: A | Discharge status: Home / Self Care | Payer: Self-pay | Source: Ambulatory Visit | Attending: Family | Admitting: Family

## 2023-08-13 DIAGNOSIS — E785 Hyperlipidemia, unspecified: Secondary | ICD-10-CM | POA: Insufficient documentation

## 2023-08-13 DIAGNOSIS — I700 Atherosclerosis of aorta: Secondary | ICD-10-CM | POA: Diagnosis not present

## 2023-08-13 DIAGNOSIS — R918 Other nonspecific abnormal finding of lung field: Secondary | ICD-10-CM | POA: Diagnosis not present

## 2023-08-13 DIAGNOSIS — I10 Essential (primary) hypertension: Secondary | ICD-10-CM | POA: Insufficient documentation

## 2023-08-30 ENCOUNTER — Other Ambulatory Visit: Payer: Self-pay | Admitting: Family

## 2023-08-30 DIAGNOSIS — I700 Atherosclerosis of aorta: Secondary | ICD-10-CM | POA: Insufficient documentation

## 2023-08-30 MED ORDER — ATORVASTATIN CALCIUM 20 MG PO TABS: 20.0000 mg | ORAL_TABLET | Freq: Every day | ORAL | 3 refills | Status: AC

## 2023-08-31 ENCOUNTER — Ambulatory Visit: Payer: Self-pay | Admitting: *Deleted

## 2023-09-02 ENCOUNTER — Other Ambulatory Visit: Payer: Self-pay | Admitting: Family

## 2023-09-02 DIAGNOSIS — Z1231 Encounter for screening mammogram for malignant neoplasm of breast: Secondary | ICD-10-CM

## 2023-09-14 ENCOUNTER — Other Ambulatory Visit: Payer: Self-pay | Admitting: Family

## 2023-09-14 NOTE — Telephone Encounter (Signed)
 Should pt transition to otc Vit D 1000-2000 units daily now or stay on high dose?

## 2023-09-27 ENCOUNTER — Ambulatory Visit
Admission: RE | Admit: 2023-09-27 | Discharge: 2023-09-27 | Disposition: A | Discharge status: Home / Self Care | Source: Ambulatory Visit | Attending: Family | Admitting: Family

## 2023-09-27 DIAGNOSIS — Z1231 Encounter for screening mammogram for malignant neoplasm of breast: Secondary | ICD-10-CM | POA: Diagnosis not present

## 2023-10-18 ENCOUNTER — Other Ambulatory Visit: Payer: Self-pay | Admitting: Family

## 2023-10-18 DIAGNOSIS — I10 Essential (primary) hypertension: Secondary | ICD-10-CM

## 2023-12-02 ENCOUNTER — Ambulatory Visit: Payer: Self-pay | Admitting: Family

## 2023-12-02 ENCOUNTER — Ambulatory Visit: Admitting: Family

## 2023-12-02 ENCOUNTER — Encounter: Payer: Self-pay | Admitting: Family

## 2023-12-02 VITALS — BP 158/87 | HR 68 | Temp 97.5°F | Ht 68.0 in | Wt 163.6 lb

## 2023-12-02 DIAGNOSIS — N3000 Acute cystitis without hematuria: Secondary | ICD-10-CM

## 2023-12-02 DIAGNOSIS — R300 Dysuria: Secondary | ICD-10-CM

## 2023-12-02 LAB — MICROSCOPIC EXAMINATION: Yeast, UA: NONE SEEN

## 2023-12-02 LAB — URINALYSIS, COMPLETE
Bilirubin, UA: NEGATIVE
Glucose, UA: NEGATIVE
Ketones, UA: NEGATIVE
Nitrite, UA: NEGATIVE
Protein,UA: NEGATIVE
Specific Gravity, UA: 1.015 (ref 1.005–1.030)
Urobilinogen, Ur: 0.2 mg/dL (ref 0.2–1.0)
pH, UA: 7.5 (ref 5.0–7.5)

## 2023-12-02 MED ORDER — CEPHALEXIN 500 MG PO CAPS: 500.0000 mg | ORAL_CAPSULE | Freq: Two times a day (BID) | ORAL | 0 refills | Status: DC

## 2023-12-02 NOTE — Patient Instructions (Signed)

## 2023-12-02 NOTE — Progress Notes (Signed)
 Subjective:    Patient ID: April Kane, female    DOB: 1961/03/15, 63 y.o.   MRN: [id number]  Chief Complaint  Patient presents with   bladder pressure    Bloated   Pt presents to the office today with bladder pressure that started two weeks. Reports being bloated after eating.  Urinary Frequency  This is a new problem. The current episode started 1 to 4 weeks ago. The problem has been unchanged. Quality: pressure. The pain is at a severity of 2/10. The pain is mild. There has been no fever. Associated symptoms include frequency, hesitancy and urgency. Pertinent negatives include no hematuria, nausea or vomiting. She has tried increased fluids for the symptoms. The treatment provided mild relief.      Review of Systems  Gastrointestinal:  Negative for nausea and vomiting.  Genitourinary:  Positive for frequency, hesitancy and urgency. Negative for hematuria.  All other systems reviewed and are negative.   Social History   Socioeconomic History   Marital status: Divorced    Spouse name: Not on file   Number of children: Not on file   Years of education: Not on file   Highest education level: Some college, no degree  Occupational History   Not on file  Tobacco Use   Smoking status: Former    Current packs/day: 0.00    Types: Cigarettes    Quit date: 08/05/2016    Years since quitting: 7.3   Smokeless tobacco: Never  Vaping Use   Vaping status: Never Used  Substance and Sexual Activity   Alcohol use: No    Alcohol/week: 0.0 standard drinks of alcohol   Drug use: No   Sexual activity: Not on file  Other Topics Concern   Not on file  Social History Narrative   Not on file   Social Drivers of Health   Financial Resource Strain: Low Risk  (05/02/2023)   Overall Financial Resource Strain (CARDIA)    Difficulty of Paying Living Expenses: Not very hard  Food Insecurity: No Food Insecurity (05/02/2023)   Hunger Vital Sign    Worried About Running Out of Food in the Last  Year: Never true    Ran Out of Food in the Last Year: Never true  Transportation Needs: No Transportation Needs (05/02/2023)   PRAPARE - Administrator, Civil Service (Medical): No    Lack of Transportation (Non-Medical): No  Physical Activity: Unknown (05/02/2023)   Exercise Vital Sign    Days of Exercise per Week: 5 days    Minutes of Exercise per Session: Not on file  Stress: No Stress Concern Present (05/02/2023)   [employer] of Occupational Health - Occupational Stress Questionnaire    Feeling of Stress : Not at all  Social Connections: Socially Isolated (05/02/2023)   Social Connection and Isolation Panel    Frequency of Communication with Friends and Family: Twice a week    Frequency of Social Gatherings with Friends and Family: Once a week    Attends Religious Services: Never    Database administrator or Organizations: No    Attends Engineer, structural: Not on file    Marital Status: Divorced   Family History  Problem Relation Age of Onset   Hypertension Mother    Cancer Father        lung   Breast cancer Maternal Grandmother         Objective:   Physical Exam Vitals reviewed.  Constitutional:  General: She is not in acute distress.    Appearance: She is well-developed.  HENT:     Head: Normocephalic and atraumatic.     Right Ear: Tympanic membrane normal.     Left Ear: Tympanic membrane normal.  Eyes:     Pupils: Pupils are equal, round, and reactive to light.  Neck:     Thyroid : No thyromegaly.  Cardiovascular:     Rate and Rhythm: Normal rate and regular rhythm.     Heart sounds: Normal heart sounds. No murmur heard. Pulmonary:     Effort: Pulmonary effort is normal. No respiratory distress.     Breath sounds: Normal breath sounds. No wheezing.  Abdominal:     General: Bowel sounds are normal. There is no distension.     Palpations: Abdomen is soft.     Tenderness: There is no abdominal tenderness.  Musculoskeletal:         General: No tenderness. Normal range of motion.     Cervical back: Normal range of motion and neck supple.  Skin:    General: Skin is warm and dry.  Neurological:     Mental Status: She is alert and oriented to person, place, and time.     Cranial Nerves: No cranial nerve deficit.     Deep Tendon Reflexes: Reflexes are normal and symmetric.  Psychiatric:        Behavior: Behavior normal.        Thought Content: Thought content normal.        Judgment: Judgment normal.       BP (!) 158/87   Pulse 68   Temp (!) 97.5 F (36.4 C) (Temporal)   Ht 5' 8 (1.727 m)   Wt 163 lb 9.6 oz (74.2 kg)   LMP 04/20/2013   BMI 24.88 kg/m      Assessment & Plan:  April Kane comes in today with chief complaint of bladder pressure  and Bloated   Diagnosis and orders addressed:  1. Dysuria (Primary) - Urinalysis, Complete - Urine Culture; Future  2. Acute cystitis without hematuria Force fluids AZO over the counter X2 days Start Keflex  Culture pending Follow up if symptoms worsen or do not improve  - cephALEXin  (KEFLEX ) 500 MG capsule; Take 1 capsule (500 mg total) by mouth 2 (two) times daily.  Dispense: 14 capsule; Refill: 0     Bari Learn, FNP

## 2023-12-03 ENCOUNTER — Other Ambulatory Visit: Payer: Self-pay | Admitting: Family

## 2023-12-07 ENCOUNTER — Other Ambulatory Visit: Payer: Self-pay | Admitting: Family

## 2023-12-09 NOTE — Telephone Encounter (Unsigned)
 Copied from CRM #[id number]. Topic: Clinical - Medication Question >> Dec 08, 2023  4:38 PM DeAngela L wrote: Reason for CRM: patient calling to check the status of refill for  Vitamin D , Ergocalciferol , (DRISDOL ) 1.25 MG (50000 UNIT) CAPS capsule After spoke and the told her it was denied  Pt num [phone] [facility])  [id number] - [place], [place] - [address] [place] [address] Phone: [phone] Fax: [phone]

## 2024-01-19 ENCOUNTER — Other Ambulatory Visit: Payer: Self-pay | Admitting: Family

## 2024-01-20 ENCOUNTER — Other Ambulatory Visit: Payer: Self-pay | Admitting: Family

## 2024-01-20 ENCOUNTER — Encounter: Payer: Self-pay | Admitting: Family

## 2024-01-20 DIAGNOSIS — I10 Essential (primary) hypertension: Secondary | ICD-10-CM

## 2024-01-20 NOTE — Telephone Encounter (Signed)
 Christy NTBS for 6 mos FU RF sent to pharmacy

## 2024-01-20 NOTE — Telephone Encounter (Signed)
 LETTER MAILED

## 2024-02-10 NOTE — Progress Notes (Signed)
 April Kane                                          MRN: [id number]   02/10/2024   The [facility] Quality Team Specialist reviewed this patient medical record for the purposes of chart review for care gap closure. The following were reviewed: chart review for care gap closure-controlling blood pressure.    [facility] Quality Team

## 2024-02-18 ENCOUNTER — Other Ambulatory Visit: Payer: Self-pay | Admitting: Family

## 2024-02-18 ENCOUNTER — Ambulatory Visit: Admitting: Family

## 2024-02-18 ENCOUNTER — Encounter: Payer: Self-pay | Admitting: Family

## 2024-02-18 VITALS — BP 147/80 | HR 66 | Temp 98.2°F | Ht 68.0 in | Wt 164.0 lb

## 2024-02-18 DIAGNOSIS — I700 Atherosclerosis of aorta: Secondary | ICD-10-CM | POA: Diagnosis not present

## 2024-02-18 DIAGNOSIS — R7303 Prediabetes: Secondary | ICD-10-CM

## 2024-02-18 DIAGNOSIS — E785 Hyperlipidemia, unspecified: Secondary | ICD-10-CM | POA: Diagnosis not present

## 2024-02-18 DIAGNOSIS — I2583 Coronary atherosclerosis due to lipid rich plaque: Secondary | ICD-10-CM

## 2024-02-18 DIAGNOSIS — I2510 Atherosclerotic heart disease of native coronary artery without angina pectoris: Secondary | ICD-10-CM | POA: Insufficient documentation

## 2024-02-18 DIAGNOSIS — M19011 Primary osteoarthritis, right shoulder: Secondary | ICD-10-CM

## 2024-02-18 DIAGNOSIS — I10 Essential (primary) hypertension: Secondary | ICD-10-CM

## 2024-02-18 DIAGNOSIS — E559 Vitamin D deficiency, unspecified: Secondary | ICD-10-CM

## 2024-02-18 DIAGNOSIS — S96911A Strain of unspecified muscle and tendon at ankle and foot level, right foot, initial encounter: Secondary | ICD-10-CM

## 2024-02-18 DIAGNOSIS — K219 Gastro-esophageal reflux disease without esophagitis: Secondary | ICD-10-CM

## 2024-02-18 LAB — CBC WITH DIFFERENTIAL/PLATELET
Basophils Absolute: 0 x10E3/uL (ref 0.0–0.2)
Basos: 0 %
EOS (ABSOLUTE): 0.3 x10E3/uL (ref 0.0–0.4)
Eos: 4 %
Hematocrit: 41.5 % (ref 34.0–46.6)
Hemoglobin: 13.1 g/dL (ref 11.1–15.9)
Immature Grans (Abs): 0 x10E3/uL (ref 0.0–0.1)
Immature Granulocytes: 0 %
Lymphocytes Absolute: 2.3 x10E3/uL (ref 0.7–3.1)
Lymphs: 33 %
MCH: 28.1 pg (ref 26.6–33.0)
MCHC: 31.6 g/dL (ref 31.5–35.7)
MCV: 89 fL (ref 79–97)
Monocytes Absolute: 0.5 x10E3/uL (ref 0.1–0.9)
Monocytes: 8 %
Neutrophils Absolute: 3.8 x10E3/uL (ref 1.4–7.0)
Neutrophils: 55 %
Platelets: 278 x10E3/uL (ref 150–450)
RBC: 4.67 x10E6/uL (ref 3.77–5.28)
RDW: 12.7 % (ref 11.7–15.4)
WBC: 6.9 x10E3/uL (ref 3.4–10.8)

## 2024-02-18 LAB — CMP14+EGFR
ALT: 18 IU/L (ref 0–32)
AST: 22 IU/L (ref 0–40)
Albumin: 4.4 g/dL (ref 3.9–4.9)
Alkaline Phosphatase: 120 IU/L (ref 49–135)
BUN/Creatinine Ratio: 7 — ABNORMAL LOW (ref 12–28)
BUN: 6 mg/dL — ABNORMAL LOW (ref 8–27)
Bilirubin Total: 0.4 mg/dL (ref 0.0–1.2)
CO2: 27 mmol/L (ref 20–29)
Calcium: 9.4 mg/dL (ref 8.7–10.3)
Chloride: 103 mmol/L (ref 96–106)
Creatinine, Ser: 0.83 mg/dL (ref 0.57–1.00)
Globulin, Total: 2.5 g/dL (ref 1.5–4.5)
Glucose: 79 mg/dL (ref 70–99)
Potassium: 3.7 mmol/L (ref 3.5–5.2)
Sodium: 143 mmol/L (ref 134–144)
Total Protein: 6.9 g/dL (ref 6.0–8.5)
eGFR: 80 mL/min/1.73 (ref >59)

## 2024-02-18 LAB — BAYER DCA HB A1C WAIVED: HB A1C (BAYER DCA - WAIVED): 4.3 % — ABNORMAL LOW (ref 4.8–5.6)

## 2024-02-18 MED ORDER — DICLOFENAC SODIUM 75 MG PO TBEC: 75.0000 mg | DELAYED_RELEASE_TABLET | Freq: Two times a day (BID) | ORAL | 2 refills | Status: DC

## 2024-02-18 MED ORDER — ASPIRIN 81 MG PO TBEC: 81.0000 mg | DELAYED_RELEASE_TABLET | Freq: Every day | ORAL | 4 refills | Status: DC

## 2024-02-18 NOTE — Progress Notes (Signed)
 Subjective:    Patient ID: April Kane, female    DOB: [birth year], 63 y.o.   MRN: [id number]  Chief Complaint  Patient presents with   Medical Management of Chronic Issues    Right ankle swelling   Pt presents to the office today for chronic follow up.    Pt has prediabetes and tries to be on low carb.   She has fibromyalgia and has been taking amitriptyline  100 mg that has helped her. Reports having increase pain and increase anxiety. Reports her pain can be 7 out 10.   She had a Ct chest on 08/30/22 that showed, Few scattered 2 mm right upper lobe pulmonary nodules have improved or stable since prior study. These could be followed with repeat CT in 1 year to ensure 2 years of stability.   Coronary artery disease.   Hepatic steatosis.   Aortic Atherosclerosis (ICD10-I70.0).  Reports her father and grandfather had lung cancer. She has hx of smoking, but quit 2019.  Has aortic atherosclerosis and CAD and takes Lipitor daily.  Hypertension This is a chronic problem. The current episode started more than 1 year ago. The problem has been waxing and waning since onset. The problem is uncontrolled. Associated symptoms include malaise/fatigue. Pertinent negatives include no peripheral edema or shortness of breath. Risk factors for coronary artery disease include dyslipidemia and obesity. The current treatment provides moderate improvement.  Gastroesophageal Reflux She complains of belching and heartburn. This is a chronic problem. The current episode started more than 1 year ago. The problem occurs occasionally. The symptoms are aggravated by certain foods. Risk factors include obesity. She has tried a PPI for the symptoms. The treatment provided moderate relief.  Arthritis Presents for follow-up visit. She complains of pain and stiffness. The symptoms have been stable. Affected locations include the right knee, left knee and right shoulder (back). Her pain is at a severity of 5/10.   Hyperlipidemia This is a chronic problem. The current episode started more than 1 year ago. The problem is uncontrolled. Recent lipid tests were reviewed and are high. Pertinent negatives include no shortness of breath. Current antihyperlipidemic treatment includes diet change and statins. The current treatment provides mild improvement of lipids. Risk factors for coronary artery disease include dyslipidemia, hypertension, a sedentary lifestyle and post-menopausal.  Ankle Pain  The incident occurred 5 to 7 days ago. There was no injury mechanism. The pain is present in the right ankle. The quality of the pain is described as aching. The pain is mild. The pain has been Improving since onset. Associated symptoms comments: swelling. She reports no foreign bodies present. The symptoms are aggravated by movement. She has tried non-weight bearing for the symptoms. The treatment provided mild relief.      Review of Systems  Constitutional:  Positive for malaise/fatigue.  Respiratory:  Negative for shortness of breath.   Gastrointestinal:  Positive for heartburn.  Musculoskeletal:  Positive for stiffness.  All other systems reviewed and are negative.      Family History  Problem Relation Age of Onset   Hypertension Mother    Cancer Father        lung   Breast cancer Maternal Grandmother    Social History   Socioeconomic History   Marital status: Divorced    Spouse name: Not on file   Number of children: Not on file   Years of education: Not on file   Highest education level: Some college, no degree  Occupational History   Not  on file  Tobacco Use   Smoking status: Former    Current packs/day: 0.00    Types: Cigarettes    Quit date: 08/05/2016    Years since quitting: 7.5   Smokeless tobacco: Never  Vaping Use   Vaping status: Never Used  Substance and Sexual Activity   Alcohol use: No    Alcohol/week: 0.0 standard drinks of alcohol   Drug use: No   Sexual activity: Not on file   Other Topics Concern   Not on file  Social History Narrative   Not on file   Social Drivers of Health   Financial Resource Strain: Low Risk  (05/02/2023)   Overall Financial Resource Strain (CARDIA)    Difficulty of Paying Living Expenses: Not very hard  Food Insecurity: No Food Insecurity (05/02/2023)   Hunger Vital Sign    Worried About Running Out of Food in the Last Year: Never true    Ran Out of Food in the Last Year: Never true  Transportation Needs: No Transportation Needs (05/02/2023)   PRAPARE - Administrator, Civil Service (Medical): No    Lack of Transportation (Non-Medical): No  Physical Activity: Unknown (05/02/2023)   Exercise Vital Sign    Days of Exercise per Week: 5 days    Minutes of Exercise per Session: Not on file  Stress: No Stress Concern Present (05/02/2023)   [employer] of Occupational Health - Occupational Stress Questionnaire    Feeling of Stress : Not at all  Social Connections: Socially Isolated (05/02/2023)   Social Connection and Isolation Panel    Frequency of Communication with Friends and Family: Twice a week    Frequency of Social Gatherings with Friends and Family: Once a week    Attends Religious Services: Never    Database Administrator or Organizations: No    Attends Engineer, Structural: Not on file    Marital Status: Divorced    Objective:   Physical Exam Vitals reviewed.  Constitutional:      General: She is not in acute distress.    Appearance: She is well-developed.  HENT:     Head: Normocephalic and atraumatic.     Right Ear: Tympanic membrane normal.     Left Ear: Tympanic membrane normal.  Eyes:     Pupils: Pupils are equal, round, and reactive to light.  Neck:     Thyroid : No thyromegaly.  Cardiovascular:     Rate and Rhythm: Normal rate and regular rhythm.     Heart sounds: Normal heart sounds. No murmur heard. Pulmonary:     Effort: Pulmonary effort is normal. No respiratory distress.      Breath sounds: Normal breath sounds. No wheezing.  Abdominal:     General: Bowel sounds are normal. There is no distension.     Palpations: Abdomen is soft.     Tenderness: There is no abdominal tenderness.  Musculoskeletal:        General: No tenderness. Normal range of motion.     Cervical back: Normal range of motion and neck supple.     Comments: Right ankle swelling  Skin:    General: Skin is warm and dry.  Neurological:     Mental Status: She is alert and oriented to person, place, and time.     Cranial Nerves: No cranial nerve deficit.     Deep Tendon Reflexes: Reflexes are normal and symmetric.  Psychiatric:        Behavior: Behavior normal.  Thought Content: Thought content normal.        Judgment: Judgment normal.       BP (!) 147/79   Pulse 66   Temp 98.2 F (36.8 C)   Ht 5' 8 (1.727 m)   Wt 164 lb (74.4 kg)   LMP 04/20/2013   SpO2 95%   BMI 24.94 kg/m      Assessment & Plan:  Rand Boller comes in today with chief complaint of Medical Management of Chronic Issues (Right ankle swelling)   Diagnosis and orders addressed:  1. Aortic atherosclerosis (Primary) - CMP14+EGFR - CBC with Differential/Platelet - aspirin EC 81 MG tablet; Take 1 tablet (81 mg total) by mouth daily. Swallow whole.  Dispense: 90 tablet; Refill: 4  2. Essential hypertension - CMP14+EGFR - CBC with Differential/Platelet  3. Gastroesophageal reflux disease, unspecified whether esophagitis present - CMP14+EGFR - CBC with Differential/Platelet  4. Hyperlipidemia, unspecified hyperlipidemia type - CMP14+EGFR - CBC with Differential/Platelet  5. Prediabetes - CMP14+EGFR - CBC with Differential/Platelet - Bayer DCA Hb A1c Waived  6. Primary osteoarthritis of right shoulder - CMP14+EGFR - CBC with Differential/Platelet  7. Vitamin D  deficiency - CMP14+EGFR - CBC with Differential/Platelet  8. Coronary artery disease due to lipid rich plaque - CMP14+EGFR - CBC with  Differential/Platelet - aspirin EC 81 MG tablet; Take 1 tablet (81 mg total) by mouth daily. Swallow whole.  Dispense: 90 tablet; Refill: 4  9. Strain of right ankle, initial encounter Rest Compression Wear ACE wrap Start diclofenac  BID with food, no other NSAID's - diclofenac  (VOLTAREN ) 75 MG EC tablet; Take 1 tablet (75 mg total) by mouth 2 (two) times daily.  Dispense: 60 tablet; Refill: 2    Labs pending Continue current medications  Health Maintenance reviewed Diet and exercise encouraged  Follow up plan: 6 months    Bari Learn, FNP

## 2024-02-18 NOTE — Patient Instructions (Signed)
 Ankle Exercises Ask your health care provider which exercises are safe for you. Do exercises exactly as told by your health care provider and adjust them as directed. It is normal to feel mild stretching, pulling, tightness, or discomfort as you do these exercises. Stop right away if you feel sudden pain or your pain gets worse. Do not begin these exercises until told by your health care provider. Stretching and range-of-motion exercises These exercises warm up your muscles and joints. They can help improve the movement and flexibility of your ankle. They may also help to relieve pain. Dorsiflexion/plantar flexion  Sit with your left / right knee straight or bent. Do not rest your foot on anything. Flex your left / right ankle to tilt the top of your foot toward your shin. This is called dorsiflexion. Hold this position for __________ seconds. Point your toes downward to tilt the top of your foot away from your shin. This is called plantar flexion. Hold this position for __________ seconds. Repeat __________ times. Complete this exercise __________ times a day. Ankle alphabet  Sit with your left / right foot supported at your lower leg. Do not rest your foot on anything. Make sure your foot has room to move freely. Think of your left / right foot as a paintbrush: Move your foot to trace each letter of the alphabet in the air. Keep your hip and knee still while you trace the letters. Make the letters as large as you can without causing or increasing any discomfort. Repeat __________ times. Complete this exercise __________ times a day. Passive ankle dorsiflexion This is an exercise in which something or someone moves your ankle for you. Sit in a chair on a non-carpeted surface. Place your left / right foot on the floor, directly under your left / right knee. Extend your left / right leg for support. Keeping your heel down, slide your left / right foot back toward the chair until you feel a  stretch at your ankle or calf. If you do not feel a stretch, slide your buttocks forward to the edge of the chair while keeping your heel down. Hold this stretch for __________ seconds. Repeat __________ times. Complete this exercise __________ times a day. Strengthening exercises These exercises build strength and endurance in your ankle. Endurance is the ability to use your muscles for a long time, even after they get tired. Dorsiflexors These are muscles that lift your foot up. Secure a rubber exercise band or tube to an object, such as a table leg, that will stay still when the band is pulled. Secure the other end around your left / right foot. Sit on the floor. Face the object with your left / right leg extended. The band or tube should be slightly tense when your foot is relaxed. Slowly flex your left / right ankle and toes to bring your foot toward your shin. Hold this position for __________ seconds. Slowly return your foot to the starting position, controlling the band as you do. Repeat __________ times. Complete this exercise __________ times a day. Plantar flexors These are muscles that push your foot down. Sit on the floor with your left / right leg extended. Loop a rubber exercise band or tube around the ball of your left / right foot. The ball of your foot is on the walking surface, right under your toes. The band or tube should be slightly tense when your foot is relaxed. Slowly point your toes downward, pushing them away from you.  Hold this position for __________ seconds. Slowly release the tension in the band or tube, controlling smoothly until your foot is back in the starting position. Repeat __________ times. Complete this exercise __________ times a day. Towel curls  Sit in a chair on a non-carpeted surface. Put your feet on the floor. Place a towel in front of your feet. If told by your health care provider, add a __________ lb / kg weight to the end of the  towel. Keeping your heel on the floor, put your left / right foot on the towel. Pull the towel toward you by grabbing the towel with your toes and curling them under. Keep your heel on the floor. Let your toes relax. Grab the towel again. Keep pulling the towel until it is completely underneath your foot. Repeat __________ times. Complete this exercise __________ times a day. Standing plantar flexion This is an exercise in which you use your toes to lift your body's weight while standing. Stand with your feet shoulder-width apart. Keep your weight spread evenly over the width of your feet while you rise up on your toes. Use a wall or table to steady yourself if needed, but try not to use it for support. If this exercise is too easy, try these options: Shift your weight toward your left / right leg until you feel challenged. If told by your health care provider, lift your uninjured leg off the floor. Hold this position for __________ seconds. Repeat __________ times. Complete this exercise __________ times a day. Tandem walking  Stand with one foot directly in front of the other. Slowly raise your back foot up, lifting your heel before your toes, and place it directly in front of your other foot. Continue to walk in this heel-to-toe way for __________ or for as long as told by your health care provider. Have a countertop or wall nearby to use if needed to keep your balance, but try not to hold onto anything for support. Repeat __________ times. Complete this exercise __________ times a day. This information is not intended to replace advice given to you by your health care provider. Make sure you discuss any questions you have with your health care provider. Document Revised: 07/15/2021 Document Reviewed: 07/15/2021 Elsevier Patient Education  2024 [employer].

## 2024-02-21 ENCOUNTER — Ambulatory Visit: Payer: Self-pay | Admitting: Family

## 2024-02-22 ENCOUNTER — Other Ambulatory Visit: Payer: Self-pay | Admitting: Family

## 2024-02-29 ENCOUNTER — Other Ambulatory Visit: Payer: Self-pay | Admitting: Family

## 2024-04-10 ENCOUNTER — Telehealth: Payer: Self-pay

## 2024-04-10 MED ORDER — CELECOXIB 200 MG PO CAPS: 200.0000 mg | ORAL_CAPSULE | Freq: Two times a day (BID) | ORAL | 1 refills | Status: DC

## 2024-04-10 NOTE — Addendum Note (Signed)
 Addended by: LAVELL LYE A on: 04/10/2024 01:45 PM   Modules accepted: Orders

## 2024-04-10 NOTE — Telephone Encounter (Signed)
 Copied from CRM #[id number]. Topic: Clinical - Medication Question >> Apr 10, 2024 10:45 AM Marda G wrote: Reason for CRM: patient is questioning why did she have an RX for low dose aspirin .

## 2024-04-10 NOTE — Telephone Encounter (Signed)
 Patient aware and verbalized understanding.

## 2024-04-10 NOTE — Telephone Encounter (Signed)
 Called and left message.

## 2024-04-10 NOTE — Telephone Encounter (Signed)
 Copied from CRM #[id number]. Topic: Clinical - Medical Advice >> Apr 10, 2024 10:47 AM Marda G wrote: Patient would like a call back regarding her A1c results and what is her provider suggesting her to do to correct it.   Also, questioning the medication:  diclofenac  (VOLTAREN ) 75 MG EC tablet it is not working.  Please advise what do about this med or what changes are needed (increase?).

## 2024-04-10 NOTE — Telephone Encounter (Signed)
 Diclofenac  changed to celebrex  200 mg BID.

## 2024-04-10 NOTE — Telephone Encounter (Signed)
 Called and left detailed message about A1c. Please review the diclofenac 

## 2024-04-19 ENCOUNTER — Other Ambulatory Visit: Payer: Self-pay | Admitting: Family

## 2024-04-19 DIAGNOSIS — I10 Essential (primary) hypertension: Secondary | ICD-10-CM

## 2024-05-13 ENCOUNTER — Other Ambulatory Visit: Payer: Self-pay | Admitting: Family

## 2024-05-13 DIAGNOSIS — M797 Fibromyalgia: Secondary | ICD-10-CM

## 2024-05-19 ENCOUNTER — Ambulatory Visit: Admitting: Family

## 2024-05-19 ENCOUNTER — Encounter: Payer: Self-pay | Admitting: Family

## 2024-05-19 VITALS — BP 137/78 | HR 65 | Temp 97.6°F | Ht 68.0 in | Wt 173.0 lb

## 2024-05-19 DIAGNOSIS — Z1211 Encounter for screening for malignant neoplasm of colon: Secondary | ICD-10-CM

## 2024-05-19 DIAGNOSIS — I700 Atherosclerosis of aorta: Secondary | ICD-10-CM

## 2024-05-19 DIAGNOSIS — E559 Vitamin D deficiency, unspecified: Secondary | ICD-10-CM | POA: Diagnosis not present

## 2024-05-19 DIAGNOSIS — M797 Fibromyalgia: Secondary | ICD-10-CM

## 2024-05-19 DIAGNOSIS — I2510 Atherosclerotic heart disease of native coronary artery without angina pectoris: Secondary | ICD-10-CM | POA: Diagnosis not present

## 2024-05-19 DIAGNOSIS — E785 Hyperlipidemia, unspecified: Secondary | ICD-10-CM | POA: Diagnosis not present

## 2024-05-19 DIAGNOSIS — I10 Essential (primary) hypertension: Secondary | ICD-10-CM

## 2024-05-19 DIAGNOSIS — M150 Primary generalized (osteo)arthritis: Secondary | ICD-10-CM | POA: Diagnosis not present

## 2024-05-19 DIAGNOSIS — I2583 Coronary atherosclerosis due to lipid rich plaque: Secondary | ICD-10-CM

## 2024-05-19 DIAGNOSIS — K219 Gastro-esophageal reflux disease without esophagitis: Secondary | ICD-10-CM

## 2024-05-19 DIAGNOSIS — Z0000 Encounter for general adult medical examination without abnormal findings: Secondary | ICD-10-CM

## 2024-05-19 DIAGNOSIS — R7303 Prediabetes: Secondary | ICD-10-CM | POA: Diagnosis not present

## 2024-05-19 LAB — BAYER DCA HB A1C WAIVED: HB A1C (BAYER DCA - WAIVED): 5.4 % (ref 4.8–5.6)

## 2024-05-19 MED ORDER — DICLOFENAC SODIUM 75 MG PO TBEC: 75.0000 mg | DELAYED_RELEASE_TABLET | Freq: Two times a day (BID) | ORAL | 2 refills | Status: AC

## 2024-05-19 MED ORDER — VITAMIN D (ERGOCALCIFEROL) 1.25 MG (50000 UNIT) PO CAPS: 50000.0000 [IU] | ORAL_CAPSULE | ORAL | 2 refills | Status: AC

## 2024-05-19 MED ORDER — FLUTICASONE PROPIONATE 50 MCG/ACT NA SUSP: 2.0000 | Freq: Every day | NASAL | 2 refills | Status: AC

## 2024-05-19 MED ORDER — ATENOLOL 50 MG PO TABS: 50.0000 mg | ORAL_TABLET | Freq: Every day | ORAL | 2 refills | Status: AC

## 2024-05-19 MED ORDER — LOSARTAN POTASSIUM 100 MG PO TABS: 100.0000 mg | ORAL_TABLET | Freq: Every day | ORAL | 2 refills | Status: AC

## 2024-05-19 MED ORDER — OMEPRAZOLE 20 MG PO CPDR: 20.0000 mg | DELAYED_RELEASE_CAPSULE | Freq: Every day | ORAL | 2 refills | Status: AC

## 2024-05-19 MED ORDER — ASPIRIN 81 MG PO TBEC: 81.0000 mg | DELAYED_RELEASE_TABLET | Freq: Every day | ORAL | 3 refills | Status: AC

## 2024-05-19 NOTE — Progress Notes (Signed)
 "  Subjective:    Patient ID: April Kane, female    DOB: 1960-08-24, 64 y.o.   MRN: [id number]  Chief Complaint  Patient presents with   Medical Management of Chronic Issues   Pt presents to the office today for CPE.     Pt has prediabetes and tries to be on low carb.   She has fibromyalgia and has been taking amitriptyline  100 mg that has helped her. Reports having increase pain and increase anxiety. Reports her pain can be 8 out 10.   She had a Ct chest on 08/13/23 that showed, Few scattered 2 mm right upper lobe pulmonary nodules have improved or stable since prior study. These could be followed with repeat CT in 1 year to ensure 2 years of stability. Coronary artery disease. Hepatic steatosis.  Aortic Atherosclerosis (ICD10-I70.0).  Has aortic atherosclerosis and CAD and takes Lipitor daily.   PT has hx of prediabetes, but last drawn her A1C was 4.3. Hypertension This is a chronic problem. The current episode started more than 1 year ago. The problem has been resolved since onset. The problem is controlled. Pertinent negatives include no malaise/fatigue, peripheral edema or shortness of breath. Risk factors for coronary artery disease include dyslipidemia and obesity. The current treatment provides moderate improvement.  Gastroesophageal Reflux She complains of belching and heartburn. This is a chronic problem. The current episode started more than 1 year ago. The problem occurs rarely. The symptoms are aggravated by certain foods. Risk factors include obesity. She has tried a PPI for the symptoms. The treatment provided moderate relief.  Arthritis Presents for follow-up visit. She complains of pain and stiffness. The symptoms have been stable. Affected locations include the right knee, left knee and right shoulder (back). Her pain is at a severity of 0/10.  Hyperlipidemia This is a chronic problem. The current episode started more than 1 year ago. The problem is uncontrolled.  Recent lipid tests were reviewed and are high. Pertinent negatives include no shortness of breath. Current antihyperlipidemic treatment includes diet change and statins. The current treatment provides mild improvement of lipids. Risk factors for coronary artery disease include dyslipidemia, hypertension, a sedentary lifestyle and post-menopausal.      Review of Systems  Constitutional:  Negative for malaise/fatigue.  Respiratory:  Negative for shortness of breath.   Gastrointestinal:  Positive for heartburn.  Musculoskeletal:  Positive for stiffness.  All other systems reviewed and are negative.      Family History  Problem Relation Age of Onset   Hypertension Mother    Cancer Father        lung   Breast cancer Maternal Grandmother    Social History   Socioeconomic History   Marital status: Divorced    Spouse name: Not on file   Number of children: Not on file   Years of education: Not on file   Highest education level: Bachelor's degree (e.g., BA, AB, BS)  Occupational History   Not on file  Tobacco Use   Smoking status: Former    Current packs/day: 0.00    Types: Cigarettes    Quit date: 08/05/2016    Years since quitting: 7.7   Smokeless tobacco: Never  Vaping Use   Vaping status: Never Used  Substance and Sexual Activity   Alcohol use: No    Alcohol/week: 0.0 standard drinks of alcohol   Drug use: No   Sexual activity: Not on file  Other Topics Concern   Not on file  Social History Narrative  Not on file   Social Drivers of Health   Tobacco Use: Medium Risk (05/19/2024)   Patient History    Smoking Tobacco Use: Former    Smokeless Tobacco Use: Never    Passive Exposure: Not on file  Financial Resource Strain: Low Risk (05/15/2024)   Overall Financial Resource Strain (CARDIA)    Difficulty of Paying Living Expenses: Not hard at all  Food Insecurity: No Food Insecurity (05/15/2024)   Epic    Worried About Programme Researcher, Broadcasting/film/video in the Last Year: Never true     Ran Out of Food in the Last Year: Never true  Transportation Needs: No Transportation Needs (05/15/2024)   Epic    Lack of Transportation (Medical): No    Lack of Transportation (Non-Medical): No  Physical Activity: Inactive (05/15/2024)   Exercise Vital Sign    Days of Exercise per Week: 0 days    Minutes of Exercise per Session: Not on file  Stress: No Stress Concern Present (05/15/2024)   [employer] of Occupational Health - Occupational Stress Questionnaire    Feeling of Stress: Not at all  Social Connections: Moderately Isolated (05/15/2024)   Social Connection and Isolation Panel    Frequency of Communication with Friends and Family: Twice a week    Frequency of Social Gatherings with Friends and Family: Once a week    Attends Religious Services: Never    Database Administrator or Organizations: No    Attends Engineer, Structural: Not on file    Marital Status: Living with partner  Depression (PHQ2-9): Low Risk (05/19/2024)   Depression (PHQ2-9)    PHQ-2 Score: 0  Alcohol Screen: Not on file  Housing: Low Risk (05/15/2024)   Epic    Unable to Pay for Housing in the Last Year: No    Number of Times Moved in the Last Year: 0    Homeless in the Last Year: No  Utilities: Not on file  Health Literacy: Not on file    Objective:   Physical Exam Vitals reviewed.  Constitutional:      General: She is not in acute distress.    Appearance: She is well-developed.  HENT:     Head: Normocephalic and atraumatic.     Right Ear: Tympanic membrane normal.     Left Ear: Tympanic membrane normal.  Eyes:     Pupils: Pupils are equal, round, and reactive to light.  Neck:     Thyroid : No thyromegaly.  Cardiovascular:     Rate and Rhythm: Normal rate and regular rhythm.     Heart sounds: Normal heart sounds. No murmur heard. Pulmonary:     Effort: Pulmonary effort is normal. No respiratory distress.     Breath sounds: Normal breath sounds. No wheezing.  Abdominal:      General: Bowel sounds are normal. There is no distension.     Palpations: Abdomen is soft.     Tenderness: There is no abdominal tenderness.  Musculoskeletal:        General: No tenderness. Normal range of motion.     Cervical back: Normal range of motion and neck supple.     Comments: Right ankle swelling  Skin:    General: Skin is warm and dry.  Neurological:     Mental Status: She is alert and oriented to person, place, and time.     Cranial Nerves: No cranial nerve deficit.     Deep Tendon Reflexes: Reflexes are normal and symmetric.  Psychiatric:  Behavior: Behavior normal.        Thought Content: Thought content normal.        Judgment: Judgment normal.       BP 137/78   Pulse 65   Temp 97.6 F (36.4 C) (Temporal)   Ht 5' 8 (1.727 m)   Wt 173 lb (78.5 kg)   LMP 04/20/2013   SpO2 100%   BMI 26.30 kg/m      Assessment & Plan:  April Kane comes in today with chief complaint of Medical Management of Chronic Issues   Diagnosis and orders addressed:  1. Essential hypertension - CBC with Differential/Platelet - CMP14+EGFR - atenolol  (TENORMIN ) 50 MG tablet; Take 1 tablet (50 mg total) by mouth daily.  Dispense: 90 tablet; Refill: 2 - losartan  (COZAAR ) 100 MG tablet; Take 1 tablet (100 mg total) by mouth daily.  Dispense: 90 tablet; Refill: 2  2. Gastroesophageal reflux disease, unspecified whether esophagitis present - CBC with Differential/Platelet - CMP14+EGFR - omeprazole  (PRILOSEC) 20 MG capsule; Take 1 capsule (20 mg total) by mouth daily.  Dispense: 90 capsule; Refill: 2  3. Annual physical exam (Primary)  - CBC with Differential/Platelet - Bayer DCA Hb A1c Waived - CMP14+EGFR - Lipid panel  4. Aortic atherosclerosis - CBC with Differential/Platelet - CMP14+EGFR - Lipid panel - aspirin  EC (ASPIRIN  81) 81 MG tablet; Take 1 tablet (81 mg total) by mouth daily. Swallow whole.  Dispense: 90 tablet; Refill: 3  5. Coronary artery disease due to  lipid rich plaque - CBC with Differential/Platelet - CMP14+EGFR - Lipid panel - aspirin  EC (ASPIRIN  81) 81 MG tablet; Take 1 tablet (81 mg total) by mouth daily. Swallow whole.  Dispense: 90 tablet; Refill: 3  6. Fibromyalgia - CBC with Differential/Platelet - CMP14+EGFR - diclofenac  (VOLTAREN ) 75 MG EC tablet; Take 1 tablet (75 mg total) by mouth 2 (two) times daily.  Dispense: 180 tablet; Refill: 2  7. Hyperlipidemia, unspecified hyperlipidemia type  - CBC with Differential/Platelet - CMP14+EGFR  8. Prediabetes - CBC with Differential/Platelet - Bayer DCA Hb A1c Waived - CMP14+EGFR  9. Vitamin D  deficiency - CBC with Differential/Platelet - CMP14+EGFR - Vitamin D , Ergocalciferol , (DRISDOL ) 1.25 MG (50000 UNIT) CAPS capsule; Take 1 capsule (50,000 Units total) by mouth once a week.  Dispense: 12 capsule; Refill: 2  10. Primary osteoarthritis involving multiple joints - CBC with Differential/Platelet - CMP14+EGFR - diclofenac  (VOLTAREN ) 75 MG EC tablet; Take 1 tablet (75 mg total) by mouth 2 (two) times daily.  Dispense: 180 tablet; Refill: 2  11. Screen for colon cancer - CBC with Differential/Platelet - CMP14+EGFR - Cologuard   Labs pending Start diclofenac  BID with food Continue current medications  Health Maintenance reviewed Diet and exercise encouraged  Follow up plan: 4 months    Bari Learn, FNP    "

## 2024-05-19 NOTE — Patient Instructions (Signed)
 Narrowing or Blocked Carotid Artery (Carotid Artery Disease): What to Know  The carotid arteries are the two main blood vessels on either side of the neck. They send blood to the brain, other parts of the head, and the neck. Carotid artery disease happens when one or both of the carotid arteries get narrow or blocked. This condition is also called carotid artery stenosis. This condition increases your risk for a stroke or a transient ischemic attack (TIA). A TIA is a mini-stroke that causes stroke-like symptoms that go away quickly. What are the causes? This condition is mainly caused by a narrowing and hardening of the carotid arteries. The carotid arteries can become narrow or clogged with a buildup of plaque. Plaque includes: Fat. Cholesterol. Calcium . Other substances. What increases the risk? Having certain medical conditions, such as: High cholesterol. High blood pressure. Diabetes. Being very overweight (obese). Smoking. A family history of cardiovascular disease. Not being active or not exercising. Being female. People who are female have a higher risk of having arteries become narrow and harden earlier in life than people who are female. Being female and older than 62 years old. Being female and older than 62 years old. What are the signs or symptoms? This condition may not have any signs or symptoms until a stroke or TIA happens. In some cases, your doctor may be able to hear a whooshing sound with a stethoscope. This can mean a change in blood flow caused by plaque buildup. How is this treated? This condition may be treated with more than one treatment. Treatment may include: Lifestyle changes, such as: Quitting smoking. Getting regular exercise as told by your doctor. Eating a healthy diet. Managing stress. Getting to and staying at a healthy weight. Medicines to control: Blood pressure. Cholesterol. Blood clotting. Surgery. You may have: A surgery to remove the  blockages in the carotid arteries. A procedure in which a small mesh tube (stent) is used to widen the blocked carotid arteries. Follow these instructions at home: Eating and drinking Follow instructions from your doctor about what you may eat and drink. It is important to: Eat a healthy diet that includes: A lot of fresh fruits and vegetables. Low-fat (lean) meats. Avoid these foods: Foods that are high in fat. Foods that are high in salt (sodium). Foods that are fried. Foods that are processed. Foods that have few good nutrients (poor nutritional value).  Lifestyle  Keep a healthy weight. Do exercises as told by your doctor to stay active. Each week, you should get one of the following: At least 150 minutes of exercise that raises your heart rate and makes you sweat. At least 75 minutes of exercise that takes a lot of effort. Do not smoke or use any products that contain nicotine or tobacco. If you need help quitting, ask your doctor. Do not drink alcohol if: Your doctor tells you not to drink. You are pregnant, may be pregnant, or are planning to become pregnant. If you drink alcohol: Limit how much you have to: 0-1 drink a day for women. 0-2 drinks a day for men. Know how much alcohol is in your drink. In the [place] one drink equals one 12 oz bottle of beer (355 mL), one 5 oz glass of wine (148 mL), or one 1 oz glass of hard liquor (44 mL). Do not use drugs. Manage your stress. Ask your doctor for tips on how to do this. General instructions Take over-the-counter and prescription medicines only as told by your doctor.  Keep all follow-up visits. Your doctor will watch your condition and may need to change your treatment plan over time. Where to find more information American Heart Association: heart.org Get help right away if: You have any signs of a stroke. BE FAST is an easy way to remember the main warning signs: B - Balance. Signs are dizziness, sudden trouble walking,  or loss of balance. E - Eyes. Signs are trouble seeing or a change in how you see. F - Face. Signs are sudden weakness or loss of feeling of the face, or the face or eyelid drooping on one side. A - Arms. Signs are weakness or loss of feeling in an arm. This happens suddenly and usually on one side of the body. S - Speech. Signs are sudden trouble speaking, slurred speech, or trouble understanding what people say. T - Time. Time to call emergency services. Write down what time symptoms started. You have other signs of a stroke, such as: A sudden, very bad headache with no known cause. Feeling like you may vomit (nausea). Vomiting. A seizure. These symptoms may be an emergency. Get help right away. Call 911. Do not wait to see if the symptoms will go away. Do not drive yourself to the hospital. This information is not intended to replace advice given to you by your health care provider. Make sure you discuss any questions you have with your health care provider. Document Revised: 02/11/2024 Document Reviewed: 09/02/2021 Elsevier Patient Education  2025 [employer].

## 2024-05-20 LAB — CMP14+EGFR
ALT: 20 [IU]/L (ref 0–32)
AST: 21 [IU]/L (ref 0–40)
Albumin: 4.1 g/dL (ref 3.9–4.9)
Alkaline Phosphatase: 105 [IU]/L (ref 49–135)
BUN/Creatinine Ratio: 9 — ABNORMAL LOW (ref 12–28)
BUN: 8 mg/dL (ref 8–27)
Bilirubin Total: 0.4 mg/dL (ref 0.0–1.2)
CO2: 26 mmol/L (ref 20–29)
Calcium: 9.1 mg/dL (ref 8.7–10.3)
Chloride: 104 mmol/L (ref 96–106)
Creatinine, Ser: 0.9 mg/dL (ref 0.57–1.00)
Globulin, Total: 2.3 g/dL (ref 1.5–4.5)
Glucose: 107 mg/dL — ABNORMAL HIGH (ref 70–99)
Potassium: 3.8 mmol/L (ref 3.5–5.2)
Sodium: 143 mmol/L (ref 134–144)
Total Protein: 6.4 g/dL (ref 6.0–8.5)
eGFR: 73 mL/min/{1.73_m2}

## 2024-05-20 LAB — CBC WITH DIFFERENTIAL/PLATELET
Basophils Absolute: 0 10*3/uL (ref 0.0–0.2)
Basos: 0 %
EOS (ABSOLUTE): 0.3 10*3/uL (ref 0.0–0.4)
Eos: 5 %
Hematocrit: 42.4 % (ref 34.0–46.6)
Hemoglobin: 13.6 g/dL (ref 11.1–15.9)
Immature Grans (Abs): 0 10*3/uL (ref 0.0–0.1)
Immature Granulocytes: 0 %
Lymphocytes Absolute: 1.9 10*3/uL (ref 0.7–3.1)
Lymphs: 29 %
MCH: 27.5 pg (ref 26.6–33.0)
MCHC: 32.1 g/dL (ref 31.5–35.7)
MCV: 86 fL (ref 79–97)
Monocytes Absolute: 0.4 10*3/uL (ref 0.1–0.9)
Monocytes: 7 %
Neutrophils Absolute: 3.9 10*3/uL (ref 1.4–7.0)
Neutrophils: 59 %
Platelets: 275 10*3/uL (ref 150–450)
RBC: 4.95 x10E6/uL (ref 3.77–5.28)
RDW: 12.9 % (ref 11.7–15.4)
WBC: 6.5 10*3/uL (ref 3.4–10.8)

## 2024-05-20 LAB — LIPID PANEL
Chol/HDL Ratio: 3.5 ratio (ref 0.0–4.4)
Cholesterol, Total: 138 mg/dL (ref 100–199)
HDL: 39 mg/dL — ABNORMAL LOW
LDL Chol Calc (NIH): 65 mg/dL (ref 0–99)
Triglycerides: 206 mg/dL — ABNORMAL HIGH (ref 0–149)
VLDL Cholesterol Cal: 34 mg/dL (ref 5–40)

## 2024-05-22 ENCOUNTER — Ambulatory Visit: Payer: Self-pay | Admitting: Family

## 2024-09-15 ENCOUNTER — Ambulatory Visit: Admitting: Family
